# Patient Record
Sex: Male | Born: 1965 | Race: Black or African American | Hispanic: No | Marital: Married | Smoking: Former smoker
Health system: Southern US, Community
[De-identification: ages and names within clinical notes are randomized; demographics above are authoritative.]

## PROBLEM LIST (undated history)

## (undated) DIAGNOSIS — E66811 Obesity, class 1: Secondary | ICD-10-CM

## (undated) DIAGNOSIS — E669 Obesity, unspecified: Secondary | ICD-10-CM

## (undated) DIAGNOSIS — I10 Essential (primary) hypertension: Secondary | ICD-10-CM

## (undated) DIAGNOSIS — D649 Anemia, unspecified: Secondary | ICD-10-CM

## (undated) DIAGNOSIS — B009 Herpesviral infection, unspecified: Principal | ICD-10-CM

## (undated) DIAGNOSIS — R748 Abnormal levels of other serum enzymes: Secondary | ICD-10-CM

## (undated) DIAGNOSIS — Z8719 Personal history of other diseases of the digestive system: Secondary | ICD-10-CM

## (undated) DIAGNOSIS — E119 Type 2 diabetes mellitus without complications: Secondary | ICD-10-CM

## (undated) HISTORY — PX: TONSILLECTOMY: SUR1361

## (undated) HISTORY — DX: Type 2 diabetes mellitus without complications: E11.9

## (undated) HISTORY — DX: Abnormal levels of other serum enzymes: R74.8

## (undated) HISTORY — DX: Personal history of other diseases of the digestive system: Z87.19

## (undated) HISTORY — PX: KNEE SURGERY: SHX244

## (undated) HISTORY — DX: Obesity, class 1: E66.811

## (undated) HISTORY — DX: Essential (primary) hypertension: I10

## (undated) HISTORY — DX: Anemia, unspecified: D64.9

## (undated) HISTORY — DX: Herpesviral infection, unspecified: B00.9

## (undated) HISTORY — DX: Obesity, unspecified: E66.9

---

## 2014-08-08 LAB — HM COLONOSCOPY

## 2016-01-23 ENCOUNTER — Ambulatory Visit: Payer: Self-pay | Admitting: Nurse Practitioner

## 2016-03-05 ENCOUNTER — Ambulatory Visit: Payer: Self-pay | Admitting: Nurse Practitioner

## 2016-04-03 ENCOUNTER — Encounter: Payer: Self-pay | Admitting: Family Medicine

## 2016-04-03 ENCOUNTER — Ambulatory Visit (INDEPENDENT_AMBULATORY_CARE_PROVIDER_SITE_OTHER): Payer: Managed Care, Other (non HMO) | Admitting: Family Medicine

## 2016-04-03 VITALS — BP 106/60 | HR 62 | Ht 68.25 in | Wt 214.2 lb

## 2016-04-03 DIAGNOSIS — I10 Essential (primary) hypertension: Secondary | ICD-10-CM | POA: Diagnosis not present

## 2016-04-03 DIAGNOSIS — Z87898 Personal history of other specified conditions: Secondary | ICD-10-CM | POA: Insufficient documentation

## 2016-04-03 DIAGNOSIS — H6122 Impacted cerumen, left ear: Secondary | ICD-10-CM | POA: Diagnosis not present

## 2016-04-03 DIAGNOSIS — E119 Type 2 diabetes mellitus without complications: Secondary | ICD-10-CM | POA: Diagnosis not present

## 2016-04-03 DIAGNOSIS — Z7689 Persons encountering health services in other specified circumstances: Secondary | ICD-10-CM | POA: Diagnosis not present

## 2016-04-03 DIAGNOSIS — H8110 Benign paroxysmal vertigo, unspecified ear: Secondary | ICD-10-CM | POA: Insufficient documentation

## 2016-04-03 DIAGNOSIS — M25511 Pain in right shoulder: Secondary | ICD-10-CM

## 2016-04-03 DIAGNOSIS — G8929 Other chronic pain: Secondary | ICD-10-CM | POA: Insufficient documentation

## 2016-04-03 LAB — CBC WITH DIFFERENTIAL/PLATELET
Basophils Absolute: 41 cells/uL (ref 0–200)
Basophils Relative: 1 %
EOS ABS: 123 {cells}/uL (ref 15–500)
Eosinophils Relative: 3 %
HEMATOCRIT: 38.3 % — AB (ref 38.5–50.0)
HEMOGLOBIN: 12.6 g/dL — AB (ref 13.2–17.1)
LYMPHS ABS: 2337 {cells}/uL (ref 850–3900)
LYMPHS PCT: 57 %
MCH: 28 pg (ref 27.0–33.0)
MCHC: 32.9 g/dL (ref 32.0–36.0)
MCV: 85.1 fL (ref 80.0–100.0)
MONO ABS: 492 {cells}/uL (ref 200–950)
MPV: 10.5 fL (ref 7.5–12.5)
Monocytes Relative: 12 %
NEUTROS PCT: 27 %
Neutro Abs: 1107 cells/uL — ABNORMAL LOW (ref 1500–7800)
Platelets: 169 10*3/uL (ref 140–400)
RBC: 4.5 MIL/uL (ref 4.20–5.80)
RDW: 14.2 % (ref 11.0–15.0)
WBC: 4.1 10*3/uL (ref 4.0–10.5)

## 2016-04-03 LAB — COMPLETE METABOLIC PANEL WITH GFR
ALBUMIN: 4.3 g/dL (ref 3.6–5.1)
ALK PHOS: 119 U/L — AB (ref 40–115)
ALT: 32 U/L (ref 9–46)
AST: 25 U/L (ref 10–35)
BUN: 13 mg/dL (ref 7–25)
CALCIUM: 9.5 mg/dL (ref 8.6–10.3)
CHLORIDE: 105 mmol/L (ref 98–110)
CO2: 26 mmol/L (ref 20–31)
CREATININE: 1.19 mg/dL (ref 0.70–1.33)
GFR, EST AFRICAN AMERICAN: 81 mL/min (ref >=60)
GFR, Est Non African American: 70 mL/min (ref >=60)
Glucose, Bld: 109 mg/dL — ABNORMAL HIGH (ref 65–99)
Potassium: 4.4 mmol/L (ref 3.5–5.3)
Sodium: 140 mmol/L (ref 135–146)
Total Bilirubin: 0.4 mg/dL (ref 0.2–1.2)
Total Protein: 7.4 g/dL (ref 6.1–8.1)

## 2016-04-03 LAB — LIPID PANEL
CHOLESTEROL: 121 mg/dL (ref <200)
HDL: 46 mg/dL (ref >40)
LDL Cholesterol: 63 mg/dL (ref <100)
TRIGLYCERIDES: 58 mg/dL (ref <150)
Total CHOL/HDL Ratio: 2.6 Ratio (ref <5.0)
VLDL: 12 mg/dL (ref <30)

## 2016-04-03 LAB — POCT GLYCOSYLATED HEMOGLOBIN (HGB A1C): Hemoglobin A1C: 6.5

## 2016-04-03 MED ORDER — ATORVASTATIN CALCIUM 40 MG PO TABS: 40.0000 mg | ORAL_TABLET | Freq: Every day | ORAL | 5 refills | Status: DC

## 2016-04-03 MED ORDER — LISINOPRIL 10 MG PO TABS: 10.0000 mg | ORAL_TABLET | Freq: Every day | ORAL | 5 refills | Status: DC

## 2016-04-03 MED ORDER — METFORMIN HCL ER 500 MG PO TB24: 500.0000 mg | ORAL_TABLET | Freq: Every evening | ORAL | 5 refills | Status: DC

## 2016-04-03 MED ORDER — NAPROXEN 500 MG PO TABS: 500.0000 mg | ORAL_TABLET | Freq: Two times a day (BID) | ORAL | 5 refills | Status: DC

## 2016-04-03 NOTE — Progress Notes (Signed)
Subjective:    Patient ID: Randall Calderon, male    DOB: 08-22-1965, 51 y.o.   MRN: [id number]  HPI Chief Complaint  Patient presents with  . new pt    new pt get established. refill on meds   He is new to the practice and here to establish care.  Previous medical care: in [place] Moved here in September 2017. Last CPE: in 2017  Concerns today include left ear itching. No ear pain, hearing loss, rhinorrhea, nasal congestion, sore throat, cough.  Denies fever, chills, unexplained weight loss, fatigue, headache, dizziness, chest pain, palpitations, abdominal pain, GI or GU symptoms.   Other providers: none in the area.   Past medical history: DM type 2 and diagnosed 1-2 years ago. States diabetes has always been well controlled and A1c is typically in the 6 range. Had a diabetic eye exam and states it was normal. Does not check blood sugars at home.  Blood pressure has been well controlled.   Right shoulder pain, chronic- has been to PT Hemorrhoids.   Family history: heart disease and stroke in mother, father with throat cancer and both deceased.   Social history: Lives with wife and 52 year old son, works as a [employer] architecttruck driver.  Former light smoker and quit many years ago. No alcohol use. History of drug use, heroin but nothing since 1998.  Diet: healthy diet. Does like ice cream.  Exercise: daily. 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 His son plays basketball.   Immunizations: zostavax May 2016. Tdap ?  Health maintenance:  Colonoscopy: 2016 and normal per patient- due in 2026 Last Eye Exam: last year and has an upcoming appointment.   Wears seatbelt always, uses sunscreen, smoke detectors in home and functioning, does not text while driving, feels safe in home environment.  Reviewed allergies, medications, past medical, surgical, family, and social history.    Review of Systems Pertinent positives and negatives in the history of present illness.     Objective:   Physical  Exam  Constitutional: He is oriented to person, place, and time. He appears well-developed and well-nourished. No distress.  HENT:  Right Ear: Hearing, tympanic membrane and ear canal normal.  Left Ear: Hearing, tympanic membrane and ear canal normal.  Nose: Nose normal.  Mouth/Throat: Uvula is midline, oropharynx is clear and moist and mucous membranes are normal.  Left ear canal with cerumen impaction initially. Post lavage TM and canal normal  Eyes: Conjunctivae are normal. Pupils are equal, round, and reactive to light.  Neck: Normal range of motion. Neck supple.  Cardiovascular: Normal rate, regular rhythm, normal heart sounds and intact distal pulses.   Pulmonary/Chest: Effort normal and breath sounds normal.  Lymphadenopathy:    He has no cervical adenopathy.  Neurological: He is alert and oriented to person, place, and time.  Skin: Skin is warm and dry. No erythema. No pallor.  Psychiatric: He has a normal mood and affect. His behavior is normal. Judgment and thought content normal.   BP 106/60   Pulse 62   Ht 5' 8.25" (1.734 m)   Wt 214 lb 3.2 oz (97.2 kg)   BMI 32.33 kg/m       Assessment & Plan:  Controlled type 2 diabetes mellitus without complication, without long-term current use of insulin (HCC) - Plan: HgB A1c, CBC with Differential/Platelet, COMPLETE METABOLIC PANEL WITH GFR, Lipid panel  Encounter to establish care  Essential hypertension - Plan: CBC with Differential/Platelet, COMPLETE METABOLIC PANEL WITH GFR, Lipid panel  Left ear impacted cerumen  Reviewed medical records brought today by patient from previous PCP in [place].  A1c 6.5% today. Diabetes is well controlled. He will continue on once daily Metformin for now. Discussed continuing with healthy lifestyle. He is exercising and has a relatively healthy diet.  Blood pressure is within goal range. Continue on medication.  Will check lipids. He is on a statin.  Left cerumen impaction- lavage  performed by Martie Lee, CMA. He tolerated this well.  Will refill medications.  Plan to have him return in 6 months for fasting med check. Follow up pending labs

## 2016-04-03 NOTE — Addendum Note (Signed)
Addended by: Herminio CommonsJOHNSON, SABRINA A on: 04/03/2016 11:06 AM   Modules accepted: Orders

## 2016-04-08 ENCOUNTER — Other Ambulatory Visit: Payer: Self-pay | Admitting: Family Medicine

## 2016-05-14 ENCOUNTER — Encounter: Payer: Self-pay | Admitting: Family Medicine

## 2016-05-15 MED ORDER — METFORMIN HCL ER 500 MG PO TB24: 500.0000 mg | ORAL_TABLET | Freq: Every evening | ORAL | 1 refills | Status: DC

## 2016-05-21 ENCOUNTER — Encounter: Payer: Self-pay | Admitting: Family Medicine

## 2016-05-22 MED ORDER — ATORVASTATIN CALCIUM 40 MG PO TABS: 40.0000 mg | ORAL_TABLET | Freq: Every day | ORAL | 5 refills | Status: DC

## 2016-10-07 ENCOUNTER — Encounter: Payer: Managed Care, Other (non HMO) | Admitting: Family Medicine

## 2016-10-07 ENCOUNTER — Telehealth: Payer: Self-pay | Admitting: Internal Medicine

## 2016-10-07 NOTE — Telephone Encounter (Signed)

## 2016-10-07 NOTE — Telephone Encounter (Signed)
I recommend he follow up in the next few weeks.

## 2016-10-08 ENCOUNTER — Encounter: Payer: Self-pay | Admitting: Family Medicine

## 2016-10-08 NOTE — Telephone Encounter (Signed)
No show letter with appt request sent.  °

## 2016-10-14 ENCOUNTER — Encounter: Payer: Self-pay | Admitting: Family Medicine

## 2016-10-18 ENCOUNTER — Ambulatory Visit (INDEPENDENT_AMBULATORY_CARE_PROVIDER_SITE_OTHER): Payer: Managed Care, Other (non HMO) | Admitting: Family Medicine

## 2016-10-18 ENCOUNTER — Encounter: Payer: Self-pay | Admitting: Family Medicine

## 2016-10-18 ENCOUNTER — Ambulatory Visit
Admission: RE | Admit: 2016-10-18 | Discharge: 2016-10-18 | Disposition: A | Discharge status: Home / Self Care | Payer: Managed Care, Other (non HMO) | Source: Ambulatory Visit | Attending: Family Medicine | Admitting: Family Medicine

## 2016-10-18 VITALS — BP 128/80 | HR 64 | Temp 97.7°F | Wt 211.6 lb

## 2016-10-18 VITALS — BP 122/70 | HR 56 | Resp 16 | Wt 211.8 lb

## 2016-10-18 DIAGNOSIS — M25561 Pain in right knee: Secondary | ICD-10-CM | POA: Diagnosis not present

## 2016-10-18 DIAGNOSIS — M25461 Effusion, right knee: Secondary | ICD-10-CM

## 2016-10-18 MED ORDER — NAPROXEN SODIUM 220 MG PO TABS: 220.0000 mg | ORAL_TABLET | Freq: Two times a day (BID) | ORAL | 0 refills | Status: DC

## 2016-10-18 MED ORDER — TRIAMCINOLONE ACETONIDE 40 MG/ML IJ SUSP
40.0000 mg | Freq: Once | INTRAMUSCULAR | Status: AC
  Administered 2016-10-18: 40 mg via INTRAMUSCULAR

## 2016-10-18 MED ORDER — LIDOCAINE HCL (PF) 2 % IJ SOLN
2.0000 mL | Freq: Once | INTRAMUSCULAR | Status: AC
  Administered 2016-10-18: 2 mL

## 2016-10-18 NOTE — Patient Instructions (Signed)
Try 2 Aleve twice daily with food, use ice 15 minutes at a time 4-6 times per day. Elevate when you can. You may continue using a knee brace if this helps.  We will call you with your XR result.

## 2016-10-18 NOTE — Progress Notes (Signed)
   Subjective:    Patient ID: Randall Calderon, male    DOB: 1965/10/17, 51 y.o.   MRN: [id number]  HPI He is here for evaluation of continued difficulty with right knee pain and effusion. His record was reviewed. He did have a positive McMurray's on his previous evaluation. He is here for possible aspiration/injection.   Review of Systems     Objective:   Physical Exam Alert and in no distress. Right knee exam does show a moderate effusion. Medial and lateral collateral ligaments intact. Negative anterior drawer. McMurray's testing medially did cause discomfort.       Assessment & Plan:  Acute pain of right knee - Plan: lidocaine (XYLOCAINE) 2 % injection 2 mL, triamcinolone acetonide (KENALOG-40) injection 40 mg  Effusion of right knee I discussed the diagnosis of probable meniscal damage and the fact that his age speaks against being overly aggressive with possible surgical intervention. Discussed injection and he agreed. The right knee was prepped laterally and 40 mg of Kenalog and 3 mL of Xylocaine was injected into the joint without difficulty. He tolerated the procedure well and did obtain some relief of his symptoms. I will refer him to physical therapy.

## 2016-10-18 NOTE — Progress Notes (Signed)
   Subjective:    Patient ID: Randall Calderon, male    DOB: 1965/12/16, 51 y.o.   MRN: [id number]  HPI Chief Complaint  Patient presents with  . knee pain    knee pain, popping sensation and feels its dislocating it and pain will go down leg   He is here with complaints of a 6 week history of right knee pain and swelling that has been gradually worsening since dancing on it last weekend. Does not recall injury at onset of pain.  Pain is anterior and medially and occasionally radiates down the medial aspect of shin. States his knee is "popping". No locking or giving away. Pian is severe when going up and down steps and improves with rest.  States he has tried ice, heat, salon pas. No oral antiinflammatories.   Denies fever, chills, redness. No other joints are painful.  States he has a history of knee arthroscopy but cannot recall which knee it was.  Denies having an orthopedist locally.   Reviewed allergies, medications, past medical, surgical, and social history.    Review of Systems Pertinent positives and negatives in the history of present illness.     Objective:   Physical Exam  Musculoskeletal:       Right knee: He exhibits effusion. He exhibits no LCL laxity and no MCL laxity. Tenderness found. Medial joint line tenderness noted.  Positive Mcmurrays No erythema, warmth.  RLE is neurovascularly intact.    BP 128/80   Pulse 64   Temp 97.7 F (36.5 C) (Oral)   Wt 211 lb 9.6 oz (96 kg)   BMI 31.94 kg/m      Assessment & Plan:  Acute pain of right knee - Plan: DG Knee Complete 4 Views Right  No sign of infection.  Will have him start taking 2 Aleve twice daily and use ice. Aleve samples given #12. Discussed conservative treatment. He has been trying topical medication and ice with minimal relief. Plan to send him for an XR of the knee and will consider referral to ortho if he is not improving with conservative treatment.

## 2016-10-21 NOTE — Addendum Note (Signed)
Addended by: Shaune SpittleHANKS, Leonardo Plaia D on: 10/21/2016 09:50 AM   Modules accepted: Level of Service

## 2016-10-24 ENCOUNTER — Encounter: Payer: Self-pay | Admitting: Family Medicine

## 2016-10-24 ENCOUNTER — Ambulatory Visit (INDEPENDENT_AMBULATORY_CARE_PROVIDER_SITE_OTHER): Payer: Managed Care, Other (non HMO) | Admitting: Family Medicine

## 2016-10-24 VITALS — BP 118/84 | HR 63 | Wt 215.2 lb

## 2016-10-24 DIAGNOSIS — D709 Neutropenia, unspecified: Secondary | ICD-10-CM | POA: Diagnosis not present

## 2016-10-24 DIAGNOSIS — Z79899 Other long term (current) drug therapy: Secondary | ICD-10-CM

## 2016-10-24 DIAGNOSIS — I10 Essential (primary) hypertension: Secondary | ICD-10-CM | POA: Diagnosis not present

## 2016-10-24 DIAGNOSIS — E119 Type 2 diabetes mellitus without complications: Secondary | ICD-10-CM

## 2016-10-24 DIAGNOSIS — E1169 Type 2 diabetes mellitus with other specified complication: Secondary | ICD-10-CM

## 2016-10-24 DIAGNOSIS — E785 Hyperlipidemia, unspecified: Secondary | ICD-10-CM

## 2016-10-24 LAB — POCT GLYCOSYLATED HEMOGLOBIN (HGB A1C): Hemoglobin A1C: 6.5

## 2016-10-24 NOTE — Progress Notes (Signed)
   Subjective:    Patient ID: Randall Calderon, Randall Calderon    DOB: Feb 22, 1965, 51 y.o.   MRN: [id number]  HPI Chief Complaint  Patient presents with  . med check    nmed check and fasting labs   He is here for a fasting med check.   He is starting PT next week for right knee pain. He is taking Aleve for knee pain.   States he is doing well on medications for HTN, DM, and hyperlipidemia. No concerns or complaints.   He drives a truck for [employer].  States he has a healthy diet and exercises.   States diabetic eye exam was earlier this year.    Denies fever, chills, dizziness, chest pain, palpitations, shortness of breath, abdominal pain, N/V/D, urinary symptoms, LE edema.   Reviewed allergies, medications, past medical, surgical, and social history.   Review of Systems Pertinent positives and negatives in the history of present illness.     Objective:   Physical Exam BP 118/84   Pulse 63   Wt 215 lb 3.2 oz (97.6 kg)   SpO2 97%   BMI 32.48 kg/m  Alert and oriented and in no acute distress. Foot exam done and normal.   Hemoglobin A1c done 6.5%     Assessment & Plan:  Controlled type 2 diabetes mellitus without complication, without long-term current use of insulin (HCC) - Plan: HgB A1c, CBC with Differential/Platelet, COMPLETE METABOLIC PANEL WITH GFR, Lipid panel, Microalbumin / creatinine urine ratio  Essential hypertension - Plan: CBC with Differential/Platelet, COMPLETE METABOLIC PANEL WITH GFR  Neutropenia, unspecified type (HCC) - Plan: CBC with Differential/Platelet  Medication management - Plan: Lipid panel  Hyperlipidemia associated with type 2 diabetes mellitus (HCC) - Plan: Lipid panel  He appears to be doing well with medications and compliance, no concerns.  Hgb A1c is 6.5% and diabetes is well controlled. Continue with healthy lifestyle and medications.  BP is at goal. Continue current medications, healthy diet and exercise.  Will check lipids and follow up. He  will continue statin.  Foot exam done and normal.  microalbumin done.  Eye exam up to date.  Declines flu shot.  Return for CPE in 6 months.

## 2016-10-25 LAB — CBC WITH DIFFERENTIAL/PLATELET
BASOS PCT: 0.7 %
Basophils Absolute: 39 cells/uL (ref 0–200)
EOS ABS: 162 {cells}/uL (ref 15–500)
Eosinophils Relative: 2.9 %
HEMATOCRIT: 38.1 % — AB (ref 38.5–50.0)
HEMOGLOBIN: 13.1 g/dL — AB (ref 13.2–17.1)
LYMPHS ABS: 3063 {cells}/uL (ref 850–3900)
MCH: 29.8 pg (ref 27.0–33.0)
MCHC: 34.4 g/dL (ref 32.0–36.0)
MCV: 86.6 fL (ref 80.0–100.0)
MPV: 10.4 fL (ref 7.5–12.5)
Monocytes Relative: 9 %
NEUTROS ABS: 1831 {cells}/uL (ref 1500–7800)
Neutrophils Relative %: 32.7 %
PLATELETS: 217 10*3/uL (ref 140–400)
RBC: 4.4 10*6/uL (ref 4.20–5.80)
RDW: 12.1 % (ref 11.0–15.0)
Total Lymphocyte: 54.7 %
WBC: 5.6 10*3/uL (ref 3.8–10.8)
WBCMIX: 504 {cells}/uL (ref 200–950)

## 2016-10-25 LAB — COMPLETE METABOLIC PANEL WITH GFR
AG RATIO: 1.6 (calc) (ref 1.0–2.5)
ALT: 24 U/L (ref 9–46)
AST: 20 U/L (ref 10–35)
Albumin: 4.5 g/dL (ref 3.6–5.1)
Alkaline phosphatase (APISO): 118 U/L — ABNORMAL HIGH (ref 40–115)
BILIRUBIN TOTAL: 0.5 mg/dL (ref 0.2–1.2)
BUN: 21 mg/dL (ref 7–25)
CHLORIDE: 102 mmol/L (ref 98–110)
CO2: 26 mmol/L (ref 20–32)
Calcium: 9.6 mg/dL (ref 8.6–10.3)
Creat: 1.09 mg/dL (ref 0.70–1.33)
GFR, EST AFRICAN AMERICAN: 90 mL/min/{1.73_m2} (ref >=60)
GFR, Est Non African American: 78 mL/min/{1.73_m2} (ref >=60)
Globulin: 2.8 g/dL (calc) (ref 1.9–3.7)
Glucose, Bld: 102 mg/dL — ABNORMAL HIGH (ref 65–99)
POTASSIUM: 4.2 mmol/L (ref 3.5–5.3)
Sodium: 136 mmol/L (ref 135–146)
Total Protein: 7.3 g/dL (ref 6.1–8.1)

## 2016-10-25 LAB — LIPID PANEL
Cholesterol: 122 mg/dL (ref <200)
HDL: 47 mg/dL (ref >40)
LDL Cholesterol (Calc): 61 mg/dL (calc)
Non-HDL Cholesterol (Calc): 75 mg/dL (calc) (ref <130)
Total CHOL/HDL Ratio: 2.6 (calc) (ref <5.0)
Triglycerides: 59 mg/dL (ref <150)

## 2016-10-25 LAB — MICROALBUMIN / CREATININE URINE RATIO
CREATININE, URINE: 341 mg/dL — AB (ref 20–320)
Microalb Creat Ratio: 4 mcg/mg creat (ref <30)
Microalb, Ur: 1.2 mg/dL

## 2016-10-29 ENCOUNTER — Ambulatory Visit: Payer: Managed Care, Other (non HMO) | Attending: Family Medicine | Admitting: Physical Therapy

## 2016-10-29 DIAGNOSIS — M25561 Pain in right knee: Secondary | ICD-10-CM | POA: Diagnosis present

## 2016-10-29 DIAGNOSIS — M25661 Stiffness of right knee, not elsewhere classified: Secondary | ICD-10-CM | POA: Diagnosis present

## 2016-10-29 DIAGNOSIS — M6281 Muscle weakness (generalized): Secondary | ICD-10-CM

## 2016-10-29 NOTE — Patient Instructions (Signed)
Hamstring Step 2    Right foot relaxed, knee straight, other leg bent, foot flat. Raise straight leg further upward to maximal range. Hold _30__ seconds. Relax leg completely down. Repeat _2-3_ times.    KNEE: Quadriceps - Prone    Place strap around ankle. Bring ankle toward buttocks. Hold _30__ seconds. _2-3_ reps per set, _1-2__ sets per day.  Extension    Lift leg up in the air and bring it back down.  Hold for 2-3 seconds. Repeat _10-15___ times. Do _1-2___ sessions per day.  Straight Leg Raise    Slowly raise locked right leg __6-8__ inches from floor.  Hold for 2-3 seconds. Repeat _10-15__ times per set. Do __1__ sets per session. Do __1-2__ sessions per day.

## 2016-10-29 NOTE — Therapy (Signed)
[facility] [address] [place], [place], [address] Phone: [phone]   Fax:  [phone]  Physical Therapy Evaluation  Patient Details  Name: Randall Calderon MRN: [id number] Date of Birth: [birth year] Referring Provider: Ronnald Nian, MD  Encounter Date: 10/29/2016      PT End of Session - 10/29/16 1010    Visit Number 1   Number of Visits 6   Date for PT Re-Evaluation 12/10/16   Authorization Type [employer]- $50 copay; 60 visit limit   PT Start Time 0927   PT Stop Time 1004   PT Time Calculation (min) 37 min   Activity Tolerance Patient tolerated treatment well   Behavior During Therapy [facility] for tasks assessed/performed      Past Medical History:  Diagnosis Date  . DM type 2 (diabetes mellitus, type 2) (HCC)   . HTN (hypertension)   . Obesity (BMI 30.0-34.9)     Past Surgical History:  Procedure Laterality Date  . KNEE SURGERY     18 years ago  . TONSILLECTOMY      There were no vitals filed for this visit.       Subjective Assessment - 10/29/16 0930    Subjective Pt is a 51 y/o male who presents to OPPT for Rt knee pain x 6-7 weeks.  Pt unable to recall MOI but reports pain since early Aug.  Pt had injection last Friday (10/25/16) with some improvement noted beginning yesterday 10/28/16.  Pt reports he's still having some pain especially after prolonged sitting.   Limitations Standing;Walking   How long can you stand comfortably? after sitting 30-40 min; just standing: 60 min   How long can you walk comfortably? only walking short distances currently   Diagnostic tests x rays: negative   Patient Stated Goals improve pain, wants to be able to run some short distances, go for walks with family   Currently in Pain? No/denies   Pain Score 0-No pain  up to 8/10   Pain Location Knee   Pain Orientation Right;Medial;Lateral;Posterior   Pain Descriptors / Indicators Stabbing;Burning  "electricity"   Pain Type Acute  pain;Chronic pain   Pain Onset More than a month ago   Pain Frequency Intermittent   Aggravating Factors  squatting, standing after prolonged sitting, walking backward up incline   Pain Relieving Factors injection, aleve            [facility] PT Assessment - 10/29/16 0938      Assessment   Medical Diagnosis Rt knee pain   Referring Provider Ronnald Nian, MD   Onset Date/Surgical Date 09/11/16   Next MD Visit PRN   Prior Therapy none recently     Precautions   Precautions None     Restrictions   Weight Bearing Restrictions No     Balance Screen   Has the patient fallen in the past 6 months No   Has the patient had a decrease in activity level because of a fear of falling?  No   Is the patient reluctant to leave their home because of a fear of falling?  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  Driving up to 1 hour at a time.   Leisure video games; videographer; regular exercise: daily body weight exercises     Cognition   Overall Cognitive Status Within Functional Limits for tasks assessed     Observation/Other Assessments   Focus on Therapeutic Outcomes (FOTO)  30 (70% limited; predicted 41% limited)     ROM / Strength   AROM / PROM / Strength AROM;Strength     AROM   AROM Assessment Site Knee   Right/Left Knee Right;Left   Right Knee Extension 0   Right Knee Flexion 118   Left Knee Extension 0   Left Knee Flexion 134     Strength   Overall Strength Comments visible rectus femoris atrophy  noted on Rt   Strength Assessment Site Hip;Knee   Right/Left Hip Right;Left   Right Hip Flexion 3+/5   Right Hip Extension 3+/5   Right Hip External Rotation  3+/5   Right Hip Internal Rotation 4/5   Right Hip ABduction 3+/5  with attempted TFL substitution   Left Hip Flexion 5/5   Left Hip Extension 4/5   Left Hip External Rotation 4/5   Left Hip Internal Rotation 5/5   Left Hip ABduction 5/5   Right/Left Knee Right;Left   Right Knee Flexion 4/5   Right Knee Extension 4-/5  with pain   Left Knee Flexion 5/5   Left Knee Extension 5/5     Flexibility   Soft Tissue Assessment /Muscle Length yes   Hamstrings tightness bil   Quadriceps tightness on Rt     Palpation   Palpation comment tightness noted in Rt lateral quad; good VMO activation     Special Tests    Special Tests --     Ambulation/Gait   Gait Comments no significant gait deviations noted during exam            Objective measurements completed on examination: See above findings.          [facility] Adult PT Treatment/Exercise - 10/29/16 0938      Exercises   Exercises Knee/Hip     Knee/Hip Exercises: Stretches   Passive Hamstring Stretch Right;1 rep;30 seconds   Quad Stretch Right;1 rep;30 seconds     Knee/Hip Exercises: Supine   Straight Leg Raises Right;5 reps     Knee/Hip Exercises: Prone   Straight Leg Raises Right;5 reps                PT Education - 10/29/16 1010    Education provided Yes   Education Details clinical findings, HEP   Person(s) Educated Patient   Methods Explanation;Demonstration;Handout   Comprehension Verbalized understanding;Returned demonstration;Need further instruction             PT Long Term Goals - 10/29/16 1116      PT LONG TERM GOAL #1   Title independent with HEP   Time 6   Period Weeks   Status New   Target Date 12/10/16     PT LONG TERM GOAL #2   Title improve Rt knee AROM 0-130 for improved motion and flexibility in order to get  into/out of truck easier   Time 6   Period Weeks   Status New   Target Date 12/10/16     PT LONG TERM GOAL #3   Title  demonstrate at least 4/5 RLE strength for improved strength and function   Time 6   Period Weeks   Status New   Target Date 12/10/16     PT LONG TERM GOAL #4   Title report pain < 3/10 with work activity for improved function and activity tolerance   Time 6   Period Weeks   Status New   Target Date 12/10/16     PT LONG TERM GOAL #5   Title demonstrate ability to perform at least 10 reps of functional squat without increase in pain for improved strength and function   Time 6   Period Weeks   Status New   Target Date 12/10/16                Plan - 10/29/16 1111    Clinical Impression Statement Pt is a 51 y/o male who presents to OPPT for recent onset of Rt knee pain.  Pt demonstrates mild ROM deficits, decreased strength with visible quad atrophy, and pain affecting functional mobility.  Pt will benefit from PT to address deficits.  May need to rule out neurological involvement if atrophy or weakness progresses, as it seems to be somewhat atypical for given diagnosis and history.  Will monitor and refer as indicated.     History and Personal Factors relevant to plan of care: HTN, DM   Clinical Presentation Evolving   Clinical Presentation due to: significant quad atrophy with unknown cause   Clinical Decision Making Moderate   Rehab Potential Good   PT Frequency 1x / week  recommend 2x/wk but pt requesting 1x/wk due to high copay   PT Duration 6 weeks   PT Treatment/Interventions ADLs/Self Care Home Management;Cryotherapy;Electrical Stimulation;Moist Heat;Ultrasound;Neuromuscular re-education;Therapeutic exercise;Therapeutic activities;Functional mobility training;Stair training;Gait training;Patient/family education;Manual techniques;Passive range of motion;Vasopneumatic Device;Taping;Dry needling   PT Next Visit Plan review HEP; add additional hip  strengthening exercises; modalities PRN for pain; ?TDN/manual to lateral quad   Consulted and Agree with Plan of Care Patient      Patient will benefit from skilled therapeutic intervention in order to improve the following deficits and impairments:  Pain, Decreased strength, Decreased mobility, Difficulty walking, Decreased range of motion, Increased muscle spasms, Increased fascial restricitons  Visit Diagnosis: Acute pain of right knee - Plan: PT plan of care cert/re-cert  Stiffness of right knee, not elsewhere classified - Plan: PT plan of care cert/re-cert  Muscle weakness (generalized) - Plan: PT plan of care cert/re-cert     Problem List Patient Active Problem List   Diagnosis Date Noted  . Controlled type 2 diabetes mellitus without complication, without long-term current use of insulin (HCC) 04/03/2016  . BPPV (benign paroxysmal positional vertigo) 04/03/2016  . History of chest pain 04/03/2016  . Chronic right shoulder pain 04/03/2016  . Essential hypertension 04/03/2016      Clarita Crane, PT, DPT 10/29/16 11:20 AM    [facility] [address] [place], [place], [address] Phone: [phone]   Fax:  [phone]  Name: Randall Calderon MRN: [id number] Date of Birth: November 01, 1965

## 2016-11-06 ENCOUNTER — Ambulatory Visit: Payer: Managed Care, Other (non HMO) | Admitting: Physical Therapy

## 2016-11-06 DIAGNOSIS — M25661 Stiffness of right knee, not elsewhere classified: Secondary | ICD-10-CM

## 2016-11-06 DIAGNOSIS — M6281 Muscle weakness (generalized): Secondary | ICD-10-CM

## 2016-11-06 DIAGNOSIS — M25561 Pain in right knee: Secondary | ICD-10-CM

## 2016-11-06 NOTE — Therapy (Signed)
[facility]patient Rehabilitation [facility] [address] [place], [place], [address] Phone: [phone]   Fax:  [phone]  Physical Therapy Treatment  Patient Details  Name: Randall Calderon MRN: [id number] Date of Birth: 12-27-1965 Referring Provider: Ronnald Nian, MD  Encounter Date: 11/06/2016      PT End of Session - 11/06/16 0844    Visit Number 2   Number of Visits 6   Date for PT Re-Evaluation 12/10/16   Authorization Type [employer]- $50 copay; 60 visit limit   PT Start Time 0757   PT Stop Time 0838   PT Time Calculation (min) 41 min   Activity Tolerance Patient tolerated treatment well   Behavior During Therapy [facility] for tasks assessed/performed      Past Medical History:  Diagnosis Date  . DM type 2 (diabetes mellitus, type 2) (HCC)   . HTN (hypertension)   . Obesity (BMI 30.0-34.9)     Past Surgical History:  Procedure Laterality Date  . KNEE SURGERY     18 years ago  . TONSILLECTOMY      There were no vitals filed for this visit.      Subjective Assessment - 11/06/16 0801    Subjective feeling better today; having a little pain on posterior medial knee as well as along medial and lateral joint line.  no brace today.  pain still with prolonged sitting.   Patient Stated Goals improve pain, wants to be able to run some short distances, go for walks with family   Currently in Pain? No/denies                         [facility] Adult PT Treatment/Exercise - 11/06/16 0802      Knee/Hip Exercises: Stretches   Passive Hamstring Stretch Right;3 reps;30 seconds   Passive Hamstring Stretch Limitations supine with strap   Quad Stretch Right;3 reps;30 seconds   Quad Stretch Limitations prone with strap     Knee/Hip Exercises: Aerobic   Stationary Bike L3 x 6 min     Knee/Hip Exercises: Machines for Strengthening   Cybex Knee Extension RLE only 15# 2x10   Cybex Knee Flexion RLE only 15# 2x10     Knee/Hip Exercises: Standing    Other Standing Knee Exercises sidestepping, forward/backward monster walk 30' x 2 each direction with red theraband     Knee/Hip Exercises: Seated   Long Arc Quad Right;10 reps   Long Arc Quad Weight 3 lbs.   Long [employer] Limitations with ball squeeze     Knee/Hip Exercises: Supine   Single Leg Bridge Both;10 reps  with ball squeeze and maintaining knee extension   Straight Leg Raises Right;10 reps   Straight Leg Raises Limitations 3#   Other Supine Knee/Hip Exercises bridging with hamstring curls on green physioball x 10 reps     Knee/Hip Exercises: Prone   Straight Leg Raises Right;10 reps   Straight Leg Raises Limitations 3#                PT Education - 11/06/16 0844    Education provided Yes   Education Details added to HEP   Person(s) Educated Patient   Methods Explanation;Demonstration;Handout   Comprehension Verbalized understanding;Returned demonstration;Need further instruction             PT Long Term Goals - 10/29/16 1116      PT LONG TERM GOAL #1   Title independent with HEP   Time 6  Period Weeks   Status New   Target Date 12/10/16     PT LONG TERM GOAL #2   Title improve Rt knee AROM 0-130 for improved motion and flexibility in order to get into/out of truck easier   Time 6   Period Weeks   Status New   Target Date 12/10/16     PT LONG TERM GOAL #3   Title demonstrate at least 4/5 RLE strength for improved strength and function   Time 6   Period Weeks   Status New   Target Date 12/10/16     PT LONG TERM GOAL #4   Title report pain < 3/10 with work activity for improved function and activity tolerance   Time 6   Period Weeks   Status New   Target Date 12/10/16     PT LONG TERM GOAL #5   Title demonstrate ability to perform at least 10 reps of functional squat without increase in pain for improved strength and function   Time 6   Period Weeks   Status New   Target Date 12/10/16               Plan - 11/06/16 0845     Clinical Impression Statement Pt reported mild ant knee pain at full extension with exercises which resolves immediately following completion of exercise.  No pain at end of session and able to tolerate strengthening exercises well.  Still with visible quad atrophy but pt reports he feels this is getting better.  Will continue to benefit from PT to maximize function.   PT Treatment/Interventions ADLs/Self Care Home Management;Cryotherapy;Electrical Stimulation;Moist Heat;Ultrasound;Neuromuscular re-education;Therapeutic exercise;Therapeutic activities;Functional mobility training;Stair training;Gait training;Patient/family education;Manual techniques;Passive range of motion;Vasopneumatic Device;Taping;Dry needling   PT Next Visit Plan review HEP; continue strengthening, hip ir/er with band; weight bearing strengthening exercises; modalities PRN for pain; ?TDN/manual to lateral quad   Consulted and Agree with Plan of Care Patient      Patient will benefit from skilled therapeutic intervention in order to improve the following deficits and impairments:  Pain, Decreased strength, Decreased mobility, Difficulty walking, Decreased range of motion, Increased muscle spasms, Increased fascial restricitons  Visit Diagnosis: Acute pain of right knee  Stiffness of right knee, not elsewhere classified  Muscle weakness (generalized)     Problem List Patient Active Problem List   Diagnosis Date Noted  . Controlled type 2 diabetes mellitus without complication, without long-term current use of insulin (HCC) 04/03/2016  . BPPV (benign paroxysmal positional vertigo) 04/03/2016  . History of chest pain 04/03/2016  . Chronic right shoulder pain 04/03/2016  . Essential hypertension 04/03/2016      Clarita Crane, PT, DPT 11/06/16 8:53 AM     [facility] [address] [place], [place], [address] Phone: [phone]   Fax:   [phone]  Name: Randall Calderon MRN: [id number] Date of Birth: 02-16-1965

## 2016-11-06 NOTE — Patient Instructions (Signed)
Bridging (Single Leg)    Lie on back with feet shoulder width apart and right leg straight with ball squeezed between knees. Lift hips toward the ceiling while keeping leg straight. Hold __3__ seconds. Repeat with other leg. Repeat _10___ times. Do _1-2___ sessions per day.   Gymball: Hamstring Curl (Double Leg)    Lie on back, calves on ball, buttocks on floor. Raise buttocks then roll ball toward buttocks. Repeat __10_ times per set. Lower buttocks to floor between rolls.  Do 1-2 sessions per day.   KNEE: Extension, Long Arc Quad (Weight)    Place weight around leg. Squeeze ball or towel between knees.  Raise leg until knee is straight. Hold _1-2_ seconds. Use _3__ lb weight. _10__ reps per set, __1-2_ sets per day, _7__ days per week

## 2016-11-07 ENCOUNTER — Other Ambulatory Visit (INDEPENDENT_AMBULATORY_CARE_PROVIDER_SITE_OTHER): Payer: Managed Care, Other (non HMO)

## 2016-11-07 DIAGNOSIS — Z23 Encounter for immunization: Secondary | ICD-10-CM

## 2016-11-13 ENCOUNTER — Ambulatory Visit: Payer: Managed Care, Other (non HMO) | Attending: Family Medicine | Admitting: Physical Therapy

## 2016-11-13 DIAGNOSIS — M6281 Muscle weakness (generalized): Secondary | ICD-10-CM | POA: Diagnosis present

## 2016-11-13 DIAGNOSIS — M25561 Pain in right knee: Secondary | ICD-10-CM | POA: Insufficient documentation

## 2016-11-13 DIAGNOSIS — M25661 Stiffness of right knee, not elsewhere classified: Secondary | ICD-10-CM | POA: Insufficient documentation

## 2016-11-13 NOTE — Therapy (Signed)
[facility] Outpatient Rehabilitation [facility] [address] [place], [place], [address] Phone: [phone]   Fax:  [phone]  Physical Therapy Treatment  Patient Details  Name: Randall Calderon MRN: [id number] Date of Birth: Sep 05, 1965 Referring Provider: Ronnald Nian, MD  Encounter Date: 11/13/2016      PT End of Session - 11/13/16 0805    Visit Number 3   Number of Visits 6   Date for PT Re-Evaluation 12/10/16   PT Start Time 0800   PT Stop Time 0855   PT Time Calculation (min) 55 min      Past Medical History:  Diagnosis Date  . DM type 2 (diabetes mellitus, type 2) (HCC)   . HTN (hypertension)   . Obesity (BMI 30.0-34.9)     Past Surgical History:  Procedure Laterality Date  . KNEE SURGERY     18 years ago  . TONSILLECTOMY      There were no vitals filed for this visit.      Subjective Assessment - 11/13/16 0803    Subjective Increased medial knee pain since last week.    Currently in Pain? Yes   Pain Score 7    Pain Location Knee   Pain Orientation Medial   Pain Descriptors / Indicators Sore;Sharp;Constant  out of line   Aggravating Factors  squatting, standing or sitting too long and getting up   Pain Relieving Factors aleve, naproxen                          [facility] Adult PT Treatment/Exercise - 11/13/16 0001      Knee/Hip Exercises: Stretches   Active Hamstring Stretch Limitations 90/90 stretc 3 x 30 sec   Quad Stretch Right;3 reps;30 seconds   Quad Stretch Limitations prone with strap     Knee/Hip Exercises: Aerobic   Stationary Bike L3 x 6 min     Knee/Hip Exercises: Supine   Quad Sets 10 reps   Quad Sets Limitations 10 sec holds    Bridges Limitations x 10, x 10 with clam tension   Straight Leg Raises Right;10 reps   Other Supine Knee/Hip Exercises clam x 20 green     Knee/Hip Exercises: Sidelying   Hip ABduction 10 reps     Modalities   Modalities Cryotherapy;Moist Heat     Moist Heat Therapy   Number Minutes Moist Heat 5 Minutes  prior to patella mobs   Moist Heat Location Knee     Cryotherapy   Number Minutes Cryotherapy 10 Minutes   Cryotherapy Location Knee   Type of Cryotherapy Ice pack     Manual Therapy   Manual Therapy Joint mobilization   Joint Mobilization Patella creep mob after HMP , also IASTM to lateral quad                      PT Long Term Goals - 10/29/16 1116      PT LONG TERM GOAL #1   Title independent with HEP   Time 6   Period Weeks   Status New   Target Date 12/10/16     PT LONG TERM GOAL #2   Title improve Rt knee AROM 0-130 for improved motion and flexibility in order to get into/out of truck easier   Time 6   Period Weeks   Status New   Target Date 12/10/16     PT LONG TERM GOAL #3   Title demonstrate at least 4/5  RLE strength for improved strength and function   Time 6   Period Weeks   Status New   Target Date 12/10/16     PT LONG TERM GOAL #4   Title report pain < 3/10 with work activity for improved function and activity tolerance   Time 6   Period Weeks   Status New   Target Date 12/10/16     PT LONG TERM GOAL #5   Title demonstrate ability to perform at least 10 reps of functional squat without increase in pain for improved strength and function   Time 6   Period Weeks   Status New   Target Date 12/10/16               Plan - 11/13/16 0849    Clinical Impression Statement Pt reports increased medial knee pain since PT last week. perfoem gentle therex, manual and patella mobs. Pt reports no change in pain prior to ice at end of session.    PT Next Visit Plan review HEP; continue strengthening, hip ir/er with band; weight bearing strengthening exercises; modalities PRN for pain; ?TDN/manual to lateral quad   Consulted and Agree with Plan of Care Patient      Patient will benefit from skilled therapeutic intervention in order to improve the following deficits and impairments:  Pain, Decreased  strength, Decreased mobility, Difficulty walking, Decreased range of motion, Increased muscle spasms, Increased fascial restricitons  Visit Diagnosis: Acute pain of right knee  Stiffness of right knee, not elsewhere classified  Muscle weakness (generalized)     Problem List Patient Active Problem List   Diagnosis Date Noted  . Controlled type 2 diabetes mellitus without complication, without long-term current use of insulin (HCC) 04/03/2016  . BPPV (benign paroxysmal positional vertigo) 04/03/2016  . History of chest pain 04/03/2016  . Chronic right shoulder pain 04/03/2016  . Essential hypertension 04/03/2016    Sherrie Mustache, PTA 11/13/2016, 8:53 AM  [facility] [address] [place], [place], [address] Phone: [phone]   Fax:  [phone]  Name: Randall Calderon MRN: [id number] Date of Birth: 30-Jul-1965

## 2016-11-18 ENCOUNTER — Ambulatory Visit: Payer: Managed Care, Other (non HMO) | Admitting: Physical Therapy

## 2016-11-18 ENCOUNTER — Telehealth: Payer: Self-pay | Admitting: Physical Therapy

## 2016-11-18 NOTE — Telephone Encounter (Signed)
Made in error. Pt walked into clinic for 8:45 appt at 9:30.

## 2016-11-25 ENCOUNTER — Encounter: Payer: Self-pay | Admitting: Physical Therapy

## 2016-11-25 ENCOUNTER — Ambulatory Visit: Payer: Managed Care, Other (non HMO) | Admitting: Physical Therapy

## 2016-11-25 DIAGNOSIS — M25561 Pain in right knee: Secondary | ICD-10-CM

## 2016-11-25 DIAGNOSIS — M25661 Stiffness of right knee, not elsewhere classified: Secondary | ICD-10-CM

## 2016-11-25 DIAGNOSIS — M6281 Muscle weakness (generalized): Secondary | ICD-10-CM

## 2016-11-25 NOTE — Therapy (Addendum)
[place], [place], [address] Phone: [phone]   Fax:  [phone]  Physical Therapy Treatment/ Discharge Summary   Patient Details  Name: Randall Calderon MRN: [id number] Date of Birth: 06-01-1965 Referring Provider: Denita Lung, MD  Encounter Date: 11/25/2016      PT End of Session - 11/25/16 0923    Visit Number 4   Number of Visits 6   Date for PT Re-Evaluation 12/10/16   PT Start Time 0848   PT Stop Time 0930   PT Time Calculation (min) 42 min   Activity Tolerance Patient tolerated treatment well   Behavior During Therapy [facility] for tasks assessed/performed      Past Medical History:  Diagnosis Date  . DM type 2 (diabetes mellitus, type 2) ([place])   . HTN (hypertension)   . Obesity (BMI 30.0-34.9)     Past Surgical History:  Procedure Laterality Date  . KNEE SURGERY     18 years ago  . TONSILLECTOMY      There were no vitals filed for this visit.      Subjective Assessment - 11/25/16 0851    Subjective "since the last session still feeling about the same"    Currently in Pain? Yes   Pain Score 4    Pain Location Knee   Pain Orientation Medial   Pain Descriptors / Indicators Aching   Pain Type Acute pain;Chronic pain   Pain Onset More than a month ago   Pain Frequency Intermittent   Aggravating Factors  layiong onthe R side, prolonged sitting, squatting   Pain Relieving Factors stretching, exercise, epsom salt                         [facility] Adult PT Treatment/Exercise - 11/25/16 0918      Knee/Hip Exercises: Stretches   Active Hamstring Stretch 2 reps;30 seconds  with strap   Gastroc Stretch 2 reps;Both;30 seconds  slant board     Knee/Hip Exercises: Seated   Long Arc Quad 2 sets;20 reps;Right     Knee/Hip Exercises: Supine   Short Arc Quad Sets 2 sets;20 reps   Other Supine Knee/Hip Exercises clam x 20 green     Modalities   Modalities Ultrasound      Ultrasound   Ultrasound Location R medial knee   Ultrasound Parameters 1.0 w/cm2 level .8 x 8 min   Ultrasound Goals Pain     Manual Therapy   Manual Therapy Soft tissue mobilization   Joint Mobilization Patella creep mob after HMP , also IASTM to lateral quad    Soft tissue mobilization IASTM over medial knee                PT Education - 11/25/16 0922    Education provided Yes   Education Details updated HEp for SAQ   Person(s) Educated Patient   Methods Explanation   Comprehension Verbalized understanding             PT Long Term Goals - 10/29/16 1116      PT LONG TERM GOAL #1   Title independent with HEP   Time 6   Period Weeks   Status New   Target Date 12/10/16     PT LONG TERM GOAL #2   Title improve Rt knee AROM 0-130 for improved motion and flexibility in order to get into/out of truck easier   Time 6   Period Weeks  Status New   Target Date 12/10/16     PT LONG TERM GOAL #3   Title demonstrate at least 4/5 RLE strength for improved strength and function   Time 6   Period Weeks   Status New   Target Date 12/10/16     PT LONG TERM GOAL #4   Title report pain < 3/10 with work activity for improved function and activity tolerance   Time 6   Period Weeks   Status New   Target Date 12/10/16     PT LONG TERM GOAL #5   Title demonstrate ability to perform at least 10 reps of functional squat without increase in pain for improved strength and function   Time 6   Period Weeks   Status New   Target Date 12/10/16               Plan - 11/25/16 0932    Clinical Impression Statement pt reports feeling about the same since the last session but has been feeling better this morining. further assessment revealed possible MCL involvement. trialed [place] and progressed quad strengthening. post session he reported no pain.    PT Next Visit Plan review HEP; how was [place], continue strengthening, hip ir/er with band; weight bearing strengthening  exercises; modalities PRN for pain; ?TDN/manual to lateral quad   PT Home Exercise Plan SAQ strengthening,    Consulted and Agree with Plan of Care Patient      Patient will benefit from skilled therapeutic intervention in order to improve the following deficits and impairments:     Visit Diagnosis: Acute pain of right knee  Stiffness of right knee, not elsewhere classified  Muscle weakness (generalized)     Problem List Patient Active Problem List   Diagnosis Date Noted  . Controlled type 2 diabetes mellitus without complication, without long-term current use of insulin ([place]) 04/03/2016  . BPPV (benign paroxysmal positional vertigo) 04/03/2016  . History of chest pain 04/03/2016  . Chronic right shoulder pain 04/03/2016  . Essential hypertension 04/03/2016   Starr Lake PT, DPT, LAT, ATC  11/25/16  9:34 AM      [facility] [address] [place], [place], [address] Phone: [phone]   Fax:  [phone]  Name: Randall Calderon MRN: [id number] Date of Birth: [birth year]      PHYSICAL THERAPY DISCHARGE SUMMARY  Visits from Start of Care: 4  Current functional level related to goals / functional outcomes: unknown   Remaining deficits: unknown   Education / Equipment: HEP, theraband  Plan: Patient agrees to discharge.  Patient goals were not met. Patient is being discharged due to not returning since the last visit.  ?????     Alzada Brazee PT, DPT, LAT, ATC  12/23/16  2:04 PM

## 2016-12-05 ENCOUNTER — Telehealth: Payer: Self-pay | Admitting: Internal Medicine

## 2016-12-05 DIAGNOSIS — M25561 Pain in right knee: Principal | ICD-10-CM

## 2016-12-05 DIAGNOSIS — G8929 Other chronic pain: Secondary | ICD-10-CM

## 2016-12-05 NOTE — Telephone Encounter (Signed)
Vickie sent this back to patient's wife.   Davina,  Thanks for the information. Since he is continuing to have pain and conservative treatment has not worked, let's get him to an orthopedist and let them determine the next step. Has he seen an orthopedist here? If not, we can refer him. Typically [facility]. Let me know.

## 2016-12-05 NOTE — Telephone Encounter (Signed)
From Patient's wife:  Randall Calderon has not seen an orthopedist as of yet. [facility] is fine. When can I call to setup and appointment for Randall Calderon?   My response to her: referral will be put in and they will contact him.

## 2016-12-05 NOTE — Telephone Encounter (Signed)
This came off of pt's Wife mycahrt message:   Hi Randall Calderon,   I wanted to reach out to you on my husband's behalf. He's usually on the road and can't always followup. He is still having lateral right knee pain. He has had an X-Ray and four weeks of physical therapy. The knee is still causing him pain. Could we move forward and schedule an MRI of his right knee? This is for Randall Grummanerrill Calderon DOB 11/26/1965. Email [email or web address] or cell [phone].   Thank you

## 2016-12-06 ENCOUNTER — Telehealth: Payer: Self-pay | Admitting: Physical Therapy

## 2016-12-06 ENCOUNTER — Ambulatory Visit: Payer: Managed Care, Other (non HMO) | Admitting: Physical Therapy

## 2016-12-06 NOTE — Telephone Encounter (Signed)
Left message regarding no-show appointment this morning. Left a reminder of next two appointments and a request to call us if he needs to reschedule. Left reminder of attendance policy.

## 2016-12-10 ENCOUNTER — Encounter (INDEPENDENT_AMBULATORY_CARE_PROVIDER_SITE_OTHER): Payer: Self-pay | Admitting: Orthopaedic Surgery

## 2016-12-10 ENCOUNTER — Ambulatory Visit (INDEPENDENT_AMBULATORY_CARE_PROVIDER_SITE_OTHER): Payer: Managed Care, Other (non HMO) | Admitting: Orthopaedic Surgery

## 2016-12-10 DIAGNOSIS — M25561 Pain in right knee: Secondary | ICD-10-CM | POA: Diagnosis not present

## 2016-12-10 DIAGNOSIS — G8929 Other chronic pain: Secondary | ICD-10-CM

## 2016-12-10 MED ORDER — MELOXICAM 7.5 MG PO TABS: 7.5000 mg | ORAL_TABLET | Freq: Two times a day (BID) | ORAL | 2 refills | Status: DC | PRN

## 2016-12-10 MED ORDER — DICLOFENAC SODIUM 1 % TD GEL: 2.0000 g | Freq: Four times a day (QID) | TRANSDERMAL | 5 refills | Status: DC

## 2016-12-10 NOTE — Progress Notes (Signed)
Office Visit Note   Patient: Randall Calderon           Date of Birth: Jun 24, 1965           MRN: [id number] Visit Date: 12/10/2016              Requested by: Avanell Shackleton, NP-C [address] [place], [place] [address] PCP: Avanell Shackleton, NP-C   Assessment & Plan: Visit Diagnoses:  1. 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  Recommend MRI to evaluate for medial meniscal tear.  Questions encouraged and answered.  Follow-up after the MRI.  Follow-Up Instructions: Return in about 10 days (around 12/20/2016).   Orders:  No orders of the defined types were placed in this encounter.  Meds ordered this encounter  Medications  . diclofenac sodium (VOLTAREN) 1 % GEL    Sig: Apply 2 g topically 4 (four) times daily.    Dispense:  1 Tube    Refill:  5  . meloxicam (MOBIC) 7.5 MG tablet    Sig: Take 1 tablet (7.5 mg total) by mouth 2 (two) times daily as needed for pain.    Dispense:  30 tablet    Refill:  2      Procedures: No procedures performed   Clinical Data: No additional findings.   Subjective: Chief Complaint  Patient presents with  . Right Knee - Pain    Patient is a 51 year old gentleman who has had right knee pain for the last 3 months with progressive worsening.  The pain is worse with prolonged sitting or standing.  He endorses start up stiffness and pain.  He works as a [employer] architect.  Denies any swelling or numbness and tingling.  He does endorse some soreness and burning.  He has had a previous cortisone injection with temporary relief.  He has done physical therapy.  Naproxen and Aleve do not help significantly.    Review of Systems  Constitutional: Negative.   All other systems reviewed and are negative.    Objective: Vital Signs: There were no vitals taken for this visit.  Physical Exam  Constitutional: He is oriented to person, place, and time. He appears  well-developed and well-nourished.  HENT:  Head: Normocephalic and atraumatic.  Eyes: Pupils are equal, round, and reactive to light.  Neck: Neck supple.  Pulmonary/Chest: Effort normal.  Abdominal: Soft.  Musculoskeletal: Normal range of motion.  Neurological: He is alert and oriented to person, place, and time.  Skin: Skin is warm.  Psychiatric: He has a normal mood and affect. His behavior is normal. Judgment and thought content normal.  Nursing note and vitals reviewed.   Ortho Exam Right knee exam shows no joint effusion.  Collaterals and cruciates are stable.  Medial joint line tenderness. Specialty Comments:  No specialty comments available.  Imaging: No results found.   PMFS History: Patient Active Problem List   Diagnosis Date Noted  . Controlled type 2 diabetes mellitus without complication, without long-term current use of insulin (HCC) 04/03/2016  . BPPV (benign paroxysmal positional vertigo) 04/03/2016  . History of chest pain 04/03/2016  . Chronic right shoulder pain 04/03/2016  . Essential hypertension 04/03/2016   Past Medical History:  Diagnosis Date  . DM type 2 (diabetes mellitus, type 2) (HCC)   . HTN (hypertension)   . Obesity (BMI 30.0-34.9)     No family history on file.  Past Surgical History:  Procedure Laterality Date  . KNEE SURGERY     18 years ago  . TONSILLECTOMY     Social History   Occupational History  . Not on file.   Social History Main Topics  . Smoking status: Former Smoker    Years: 5.00  . Smokeless tobacco: Never Used  . Alcohol use No  . Drug use: No     Comment: history of heroin use and stopped 1998.   Marland Kitchen. Sexual activity: Not on file

## 2016-12-13 ENCOUNTER — Ambulatory Visit: Payer: Managed Care, Other (non HMO) | Admitting: Physical Therapy

## 2016-12-18 ENCOUNTER — Other Ambulatory Visit: Payer: Self-pay | Admitting: Family Medicine

## 2016-12-20 ENCOUNTER — Ambulatory Visit (INDEPENDENT_AMBULATORY_CARE_PROVIDER_SITE_OTHER): Payer: Managed Care, Other (non HMO) | Admitting: Orthopaedic Surgery

## 2016-12-20 ENCOUNTER — Encounter: Payer: Managed Care, Other (non HMO) | Admitting: Physical Therapy

## 2016-12-21 ENCOUNTER — Ambulatory Visit
Admission: RE | Admit: 2016-12-21 | Discharge: 2016-12-21 | Disposition: A | Discharge status: Home / Self Care | Payer: Managed Care, Other (non HMO) | Source: Ambulatory Visit | Attending: Orthopaedic Surgery | Admitting: Orthopaedic Surgery

## 2016-12-21 DIAGNOSIS — M25561 Pain in right knee: Principal | ICD-10-CM

## 2016-12-21 DIAGNOSIS — G8929 Other chronic pain: Secondary | ICD-10-CM

## 2016-12-23 ENCOUNTER — Encounter (INDEPENDENT_AMBULATORY_CARE_PROVIDER_SITE_OTHER): Payer: Self-pay | Admitting: Orthopaedic Surgery

## 2016-12-23 ENCOUNTER — Ambulatory Visit (INDEPENDENT_AMBULATORY_CARE_PROVIDER_SITE_OTHER): Payer: Managed Care, Other (non HMO) | Admitting: Orthopaedic Surgery

## 2016-12-23 DIAGNOSIS — M1711 Unilateral primary osteoarthritis, right knee: Secondary | ICD-10-CM | POA: Diagnosis not present

## 2016-12-23 DIAGNOSIS — M23303 Other meniscus derangements, unspecified medial meniscus, right knee: Secondary | ICD-10-CM

## 2016-12-23 MED ORDER — LIDOCAINE HCL 1 % IJ SOLN
2.0000 mL | INTRAMUSCULAR | Status: AC | PRN
  Administered 2016-12-23: 2 mL

## 2016-12-23 MED ORDER — BUPIVACAINE HCL 0.5 % IJ SOLN
2.0000 mL | INTRAMUSCULAR | Status: AC | PRN
  Administered 2016-12-23: 2 mL via INTRA_ARTICULAR

## 2016-12-23 MED ORDER — METHYLPREDNISOLONE ACETATE 40 MG/ML IJ SUSP
40.0000 mg | INTRAMUSCULAR | Status: AC | PRN
  Administered 2016-12-23: 40 mg via INTRA_ARTICULAR

## 2016-12-23 NOTE — Progress Notes (Signed)
Office Visit Note   Patient: Randall Calderon           Date of Birth: 1965-06-19           MRN: [id number] VisLeitha Schullerit Date: 12/23/2016              Requested by: Avanell ShackletonHenson, Vickie L, NP-C [address] [place], [place] [id number] PCP: Avanell ShackletonHenson, Vickie L, NP-C   Assessment & Plan: Visit Diagnoses:  1. Unilateral primary osteoarthritis, right knee   2. Derangement of medial meniscus, right     Plan: MRI findings are consistent with tricompartmental degenerative joint disease that is moderate with a complex tear of the medial meniscus and a root avulsion with a dysfunctional meniscus.  We discussed treatment options that include knee arthroscopy and debridement to see if this will improve his medial sided symptoms although he does understand that he does have degenerative joint disease of the medial compartment also.  He will give us a call for surgery in January.  For now he would like to have a cortisone injection which we performed today.  Follow-Up Instructions: Return if symptoms worsen or fail to improve.   Orders:  No orders of the defined types were placed in this encounter.  No orders of the defined types were placed in this encounter.     Procedures: Large Joint Inj: R knee on 12/23/2016 9:25 AM Indications: pain Details: 22 G needle  Arthrogram: No  Medications: 40 mg methylPREDNISolone acetate 40 MG/ML; 2 mL lidocaine 1 %; 2 mL bupivacaine 0.5 % Consent was given by the patient. Patient was prepped and draped in the usual sterile fashion.       Clinical Data: No additional findings.   Subjective: Chief Complaint  Patient presents with  . Right Knee - Pain, Follow-up    Patient follows up today for his right knee MRI.  He continues to have mainly medial sided pain.  He has had temporary relief from the previous injection.    Review of Systems  Constitutional: Negative.   All other systems reviewed and are negative.    Objective: Vital Signs: There were  no vitals taken for this visit.  Physical Exam  Constitutional: He is oriented to person, place, and time. He appears well-developed and well-nourished.  Pulmonary/Chest: Effort normal.  Abdominal: Soft.  Neurological: He is alert and oriented to person, place, and time.  Skin: Skin is warm.  Psychiatric: He has a normal mood and affect. His behavior is normal. Judgment and thought content normal.  Nursing note and vitals reviewed.   Ortho Exam Right knee exam shows no joint effusion.  Medial joint line tenderness. Specialty Comments:  No specialty comments available.  Imaging: No results found.   PMFS History: Patient Active Problem List   Diagnosis Date Noted  . Derangement of medial meniscus, right 12/23/2016  . Unilateral primary osteoarthritis, right knee 12/23/2016  . Controlled type 2 diabetes mellitus without complication, without long-term current use of insulin (HCC) 04/03/2016  . BPPV (benign paroxysmal positional vertigo) 04/03/2016  . History of chest pain 04/03/2016  . Chronic right shoulder pain 04/03/2016  . Essential hypertension 04/03/2016   Past Medical History:  Diagnosis Date  . DM type 2 (diabetes mellitus, type 2) (HCC)   . HTN (hypertension)   . Obesity (BMI 30.0-34.9)     History reviewed. No pertinent family history.  Past Surgical History:  Procedure Laterality Date  . KNEE SURGERY     18 years ago  . TONSILLECTOMY  Social History   Occupational History  . Not on file  Tobacco Use  . Smoking status: Former Smoker    Years: 5.00  . Smokeless tobacco: Never Used  Substance and Sexual Activity  . Alcohol use: No  . 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   Marland Kitchen. Sexual activity: Not on file

## 2017-01-09 ENCOUNTER — Telehealth (INDEPENDENT_AMBULATORY_CARE_PROVIDER_SITE_OTHER): Payer: Self-pay

## 2017-01-09 NOTE — Telephone Encounter (Signed)
Wife emailed me   "Good afternoon, My husband Randall Calderon surgery is next Friday, we are still waiting on the letter to send so that we can send to his job. We need the letter to start the paperwork for his FMLA and short-term disability. They are requesting a letter from his doctor's office that will have how long he could be out and the day of his surgery. Do you know when the letter will be emailed to Wallie and me? [email or web address]"

## 2017-01-09 NOTE — Telephone Encounter (Signed)
He will be out 6-8 weeks

## 2017-01-09 NOTE — Telephone Encounter (Signed)
Please advise. Thanks.  

## 2017-01-09 NOTE — Telephone Encounter (Signed)
Do you know exactly when the SU will be? I do not see anything on the Surgeries Tab? Then I will be able to complete the letter Thanks.

## 2017-01-10 ENCOUNTER — Encounter (INDEPENDENT_AMBULATORY_CARE_PROVIDER_SITE_OTHER): Payer: Self-pay

## 2017-01-10 NOTE — Telephone Encounter (Signed)
Letter made and emailed to [email or web address]

## 2017-01-10 NOTE — Telephone Encounter (Signed)
01-17-17 

## 2017-01-17 DIAGNOSIS — M23303 Other meniscus derangements, unspecified medial meniscus, right knee: Secondary | ICD-10-CM

## 2017-01-17 DIAGNOSIS — M659 Synovitis and tenosynovitis, unspecified: Secondary | ICD-10-CM | POA: Diagnosis not present

## 2017-01-18 ENCOUNTER — Encounter (INDEPENDENT_AMBULATORY_CARE_PROVIDER_SITE_OTHER): Payer: Self-pay | Admitting: Orthopaedic Surgery

## 2017-01-18 DIAGNOSIS — M659 Synovitis and tenosynovitis, unspecified: Secondary | ICD-10-CM | POA: Insufficient documentation

## 2017-01-18 DIAGNOSIS — M65969 Unspecified synovitis and tenosynovitis, unspecified lower leg: Secondary | ICD-10-CM | POA: Insufficient documentation

## 2017-01-31 ENCOUNTER — Ambulatory Visit (INDEPENDENT_AMBULATORY_CARE_PROVIDER_SITE_OTHER): Payer: Managed Care, Other (non HMO) | Admitting: Orthopaedic Surgery

## 2017-01-31 DIAGNOSIS — M23303 Other meniscus derangements, unspecified medial meniscus, right knee: Secondary | ICD-10-CM

## 2017-01-31 DIAGNOSIS — M1711 Unilateral primary osteoarthritis, right knee: Secondary | ICD-10-CM

## 2017-01-31 NOTE — Progress Notes (Signed)
Patient is status post right knee arthroscopy with partial medial meniscectomy.  He endorses moderate pain at times.  He examined with crutches.  He just started doing the stationary bike.  He takes hydrocodone as needed.  The incisions have healed without signs of infection or drainage.  Sutures removed today.  He has no joint effusion.  His range of motion is 0-90 degrees.  Arthroscopy pictures were reviewed with the patient which does show mainly degenerative joint disease medial compartment and the patellofemoral compartment.  At this point he continues to have relative quad weakness and stiffness of the knee related to the surgery.  He needs to remain out of work so that he can strengthen his quadriceps and improve his range of motion.  I have given him home exercises to help with this.  Follow-up in 4 weeks for recheck.

## 2017-02-28 ENCOUNTER — Ambulatory Visit (INDEPENDENT_AMBULATORY_CARE_PROVIDER_SITE_OTHER): Payer: Managed Care, Other (non HMO) | Admitting: Orthopaedic Surgery

## 2017-02-28 ENCOUNTER — Encounter (INDEPENDENT_AMBULATORY_CARE_PROVIDER_SITE_OTHER): Payer: Self-pay | Admitting: Orthopaedic Surgery

## 2017-02-28 DIAGNOSIS — M23303 Other meniscus derangements, unspecified medial meniscus, right knee: Secondary | ICD-10-CM

## 2017-02-28 DIAGNOSIS — M1711 Unilateral primary osteoarthritis, right knee: Secondary | ICD-10-CM

## 2017-02-28 NOTE — Progress Notes (Signed)
Patient is 6 weeks status post right knee arthroscopy with debridement.  He is feeling much better.  He still has some discomfort with squatting and with walking on concrete floors.  Denies any swelling.  Surgical scars are fully healed.  No joint effusion.  He has good range of motion and decent quad strength.  At this point I would like him to work on quad strengthening and range of motion for 2 more weeks and then he can return back to work as scheduled on March 14, 2017.  Work note was provided today.  Questions encouraged and answered.  Follow-up as needed.

## 2017-03-11 ENCOUNTER — Other Ambulatory Visit: Payer: Self-pay | Admitting: Family Medicine

## 2017-03-12 ENCOUNTER — Telehealth (INDEPENDENT_AMBULATORY_CARE_PROVIDER_SITE_OTHER): Payer: Self-pay | Admitting: Orthopaedic Surgery

## 2017-03-12 ENCOUNTER — Other Ambulatory Visit (INDEPENDENT_AMBULATORY_CARE_PROVIDER_SITE_OTHER): Payer: Self-pay

## 2017-03-12 DIAGNOSIS — M25561 Pain in right knee: Secondary | ICD-10-CM

## 2017-03-12 MED ORDER — NAPROXEN SODIUM 220 MG PO TABS: 220.0000 mg | ORAL_TABLET | Freq: Two times a day (BID) | ORAL | 0 refills | Status: DC

## 2017-03-12 NOTE — Telephone Encounter (Signed)
Left message to find out if he still needs this med for his knee pain as that's why it was prescribed for

## 2017-03-12 NOTE — Telephone Encounter (Signed)
RX SENT TO HIS PHARM PATIENT AWARE.

## 2017-03-12 NOTE — Telephone Encounter (Signed)
Ok to call in a refill.  Make sure he is no longer taking mobic though

## 2017-03-12 NOTE — Telephone Encounter (Signed)
Patient called needing Rx refilled (Naproxen) The number to contact patient is [phone]

## 2017-03-12 NOTE — Telephone Encounter (Signed)
Please advise. See message below.  

## 2017-03-12 NOTE — Telephone Encounter (Signed)
Pt needed this for his knee pain as he just had surgery on it and stll in pain. Pt was advised to contact his ortho that did surgery to get medicine from them as they have taken over his care for knee

## 2017-03-17 ENCOUNTER — Other Ambulatory Visit: Payer: Self-pay | Admitting: Family Medicine

## 2017-03-18 ENCOUNTER — Other Ambulatory Visit: Payer: Self-pay | Admitting: Family Medicine

## 2017-03-18 ENCOUNTER — Telehealth (INDEPENDENT_AMBULATORY_CARE_PROVIDER_SITE_OTHER): Payer: Self-pay | Admitting: Orthopaedic Surgery

## 2017-03-18 ENCOUNTER — Other Ambulatory Visit (INDEPENDENT_AMBULATORY_CARE_PROVIDER_SITE_OTHER): Payer: Self-pay

## 2017-03-18 DIAGNOSIS — M25561 Pain in right knee: Secondary | ICD-10-CM

## 2017-03-18 MED ORDER — NAPROXEN SODIUM 220 MG PO TABS
220.0000 mg | ORAL_TABLET | Freq: Two times a day (BID) | ORAL | 0 refills | Status: DC
Start: 2017-03-18 — End: 2017-04-23

## 2017-03-18 NOTE — Telephone Encounter (Signed)
Message sent in error

## 2017-03-18 NOTE — Telephone Encounter (Signed)
Called patient and advised him to come pick up Rx here in our office.

## 2017-03-18 NOTE — Telephone Encounter (Signed)
Patient came into the clinic stating that the pharmacy does not have his prescription.

## 2017-04-15 ENCOUNTER — Ambulatory Visit (INDEPENDENT_AMBULATORY_CARE_PROVIDER_SITE_OTHER): Payer: Managed Care, Other (non HMO) | Admitting: Orthopaedic Surgery

## 2017-04-15 ENCOUNTER — Encounter (INDEPENDENT_AMBULATORY_CARE_PROVIDER_SITE_OTHER): Payer: Self-pay | Admitting: Orthopaedic Surgery

## 2017-04-15 DIAGNOSIS — M1711 Unilateral primary osteoarthritis, right knee: Secondary | ICD-10-CM

## 2017-04-15 MED ORDER — DICLOFENAC SODIUM 75 MG PO TBEC: 75.0000 mg | DELAYED_RELEASE_TABLET | Freq: Two times a day (BID) | ORAL | 2 refills | Status: DC

## 2017-04-15 MED ORDER — BUPIVACAINE HCL 0.5 % IJ SOLN
2.0000 mL | INTRAMUSCULAR | Status: AC | PRN
  Administered 2017-04-15: 2 mL via INTRA_ARTICULAR

## 2017-04-15 MED ORDER — METHYLPREDNISOLONE ACETATE 40 MG/ML IJ SUSP
40.0000 mg | INTRAMUSCULAR | Status: AC | PRN
  Administered 2017-04-15: 40 mg via INTRA_ARTICULAR

## 2017-04-15 MED ORDER — LIDOCAINE HCL 1 % IJ SOLN
2.0000 mL | INTRAMUSCULAR | Status: AC | PRN
  Administered 2017-04-15: 2 mL

## 2017-04-15 MED ORDER — NAPROXEN 500 MG PO TABS: 500.0000 mg | ORAL_TABLET | Freq: Two times a day (BID) | ORAL | 3 refills | Status: DC

## 2017-04-15 NOTE — Progress Notes (Signed)
Office Visit Note   Patient: Randall Calderon           Date of Birth: 09-Oct-1965           MRN: [id number] Visit Date: 04/15/2017              Requested by: Avanell Shackleton, NP-C [address] [place], [place] [address] PCP: Avanell Shackleton, NP-C   Assessment & Plan: Visit Diagnoses:  1. Unilateral primary osteoarthritis, right knee     Plan: Impression is right knee degenerative joint disease status post knee arthroscopy.  I aspirated 32 cc of joint fluid and then injected with cortisone.  Patient tolerated this well.  Prescription for naproxen and diclofenac.  We did discuss that he will likely need an total knee replacement in the near future.  Questions encouraged and answered.  Follow-up as needed.  Follow-Up Instructions: Return if symptoms worsen or fail to improve.   Orders:  Orders Placed This Encounter  Procedures  . Cell count + diff,  w/ cryst-synvl fld   Meds ordered this encounter  Medications  . diclofenac (VOLTAREN) 75 MG EC tablet    Sig: Take 1 tablet (75 mg total) by mouth 2 (two) times daily.    Dispense:  30 tablet    Refill:  2  . naproxen (NAPROSYN) 500 MG tablet    Sig: Take 1 tablet (500 mg total) by mouth 2 (two) times daily with a meal.    Dispense:  30 tablet    Refill:  3      Procedures: Large Joint Inj: R knee on 04/15/2017 11:02 AM Indications: pain Details: 22 G needle  Arthrogram: No  Medications: 40 mg methylPREDNISolone acetate 40 MG/ML; 2 mL lidocaine 1 %; 2 mL bupivacaine 0.5 % Aspirate: 32 mL clear; sent for lab analysis Outcome: tolerated well, no immediate complications Consent was given by the patient. Patient was prepped and draped in the usual sterile fashion.       Clinical Data: No additional findings.   Subjective: Chief Complaint  Patient presents with  . Right Knee - Follow-up    Patient is almost 3 months status post right knee arthroscopy with debridement.  He is coming in today with pain and  swelling.  Denies any constitutional symptoms.  He takes naproxen with relief.    Review of Systems   Objective: Vital Signs: There were no vitals taken for this visit.  Physical Exam  Ortho Exam Right knee exam shows fully healed surgical scars.  He has normal range of motion. Specialty Comments:  No specialty comments available.  Imaging: No results found.   PMFS History: Patient Active Problem List   Diagnosis Date Noted  . Synovitis of knee 01/18/2017  . Derangement of medial meniscus, right 12/23/2016  . Unilateral primary osteoarthritis, right knee 12/23/2016  . Controlled type 2 diabetes mellitus without complication, without long-term current use of insulin (HCC) 04/03/2016  . BPPV (benign paroxysmal positional vertigo) 04/03/2016  . History of chest pain 04/03/2016  . Chronic right shoulder pain 04/03/2016  . Essential hypertension 04/03/2016   Past Medical History:  Diagnosis Date  . DM type 2 (diabetes mellitus, type 2) (HCC)   . HTN (hypertension)   . Obesity (BMI 30.0-34.9)     History reviewed. No pertinent family history.  Past Surgical History:  Procedure Laterality Date  . KNEE SURGERY     18 years ago  . TONSILLECTOMY     Social History   Occupational History  .  Not on file  Tobacco Use  . Smoking status: Former Smoker    Years: 5.00  . Smokeless tobacco: Never Used  Substance and Sexual Activity  . Alcohol use: No  . 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   Marland Kitchen. Sexual activity: Not on file

## 2017-04-16 LAB — SYNOVIAL CELL COUNT + DIFF, W/ CRYSTALS
Basophils, %: 0 %
Eosinophils-Synovial: 0 % (ref 0–2)
LYMPHOCYTES-SYNOVIAL FLD: 35 % (ref 0–74)
Monocyte/Macrophage: 60 % (ref 0–69)
Neutrophil, Synovial: 3 % (ref 0–24)
SYNOVIOCYTES, %: 2 % (ref 0–15)
WBC, Synovial: 291 cells/uL — ABNORMAL HIGH (ref <150)

## 2017-04-16 LAB — TIQ-NTM

## 2017-04-19 ENCOUNTER — Other Ambulatory Visit: Payer: Self-pay | Admitting: Family Medicine

## 2017-04-21 ENCOUNTER — Other Ambulatory Visit: Payer: Self-pay | Admitting: Family Medicine

## 2017-04-21 NOTE — Telephone Encounter (Signed)
Pt has an appt coming up 

## 2017-04-22 ENCOUNTER — Encounter: Payer: Managed Care, Other (non HMO) | Admitting: Family Medicine

## 2017-04-23 ENCOUNTER — Encounter: Payer: Self-pay | Admitting: Family Medicine

## 2017-04-23 ENCOUNTER — Ambulatory Visit: Payer: Managed Care, Other (non HMO) | Admitting: Family Medicine

## 2017-04-23 VITALS — BP 130/82 | HR 62 | Ht 69.5 in | Wt 226.4 lb

## 2017-04-23 DIAGNOSIS — E6609 Other obesity due to excess calories: Secondary | ICD-10-CM | POA: Diagnosis not present

## 2017-04-23 DIAGNOSIS — Z114 Encounter for screening for human immunodeficiency virus [HIV]: Secondary | ICD-10-CM | POA: Diagnosis not present

## 2017-04-23 DIAGNOSIS — Z79899 Other long term (current) drug therapy: Secondary | ICD-10-CM

## 2017-04-23 DIAGNOSIS — I10 Essential (primary) hypertension: Secondary | ICD-10-CM

## 2017-04-23 DIAGNOSIS — Z6832 Body mass index (BMI) 32.0-32.9, adult: Secondary | ICD-10-CM

## 2017-04-23 DIAGNOSIS — E119 Type 2 diabetes mellitus without complications: Secondary | ICD-10-CM | POA: Diagnosis not present

## 2017-04-23 DIAGNOSIS — Z0000 Encounter for general adult medical examination without abnormal findings: Secondary | ICD-10-CM | POA: Diagnosis not present

## 2017-04-23 LAB — POCT GLYCOSYLATED HEMOGLOBIN (HGB A1C)

## 2017-04-23 LAB — POCT URINALYSIS DIP (PROADVANTAGE DEVICE)
Bilirubin, UA: NEGATIVE
Blood, UA: NEGATIVE
Glucose, UA: NEGATIVE mg/dL
Ketones, POC UA: NEGATIVE mg/dL
LEUKOCYTES UA: NEGATIVE
NITRITE UA: NEGATIVE
PH UA: 6 (ref 5.0–8.0)
PROTEIN UA: NEGATIVE mg/dL
Specific Gravity, Urine: 1.03
Urobilinogen, Ur: NEGATIVE

## 2017-04-23 NOTE — Patient Instructions (Addendum)
Your hemoglobin A1c is 5.9% and your diabetes is well controlled.  Your BMI or body mass index is now in the obese range.  Cut back on calories, mainly carbohydrates. Watch your portion sizes and make sure you are eating healthy foods.  Increase your physical activity as your knee tolerates.   We will call you with your lab results.   Follow up in 6 months for diabetes check.   Preventative Care for Adults, Male       REGULAR HEALTH EXAMS:  A routine yearly physical is a good way to check in with your primary care provider about your health and preventive screening. It is also an opportunity to share updates about your health and any concerns you have, and receive a thorough all-over exam.   Most health insurance companies pay for at least some preventative services.  Check with your health plan for specific coverages.  WHAT PREVENTATIVE SERVICES DO MEN NEED?  Adult men should have their weight and blood pressure checked regularly.   Men age 53 and older should have their cholesterol levels checked regularly.  Beginning at age 53 and continuing to age 53, men should be screened for colorectal cancer.  Certain people should may need continued testing until age 53.  Other cancer screening may include exams for testicular and prostate cancer.  Updating vaccinations is part of preventative care.  Vaccinations help protect against diseases such as the flu.  Lab tests are generally done as part of preventative care to screen for anemia and blood disorders, to screen for problems with the kidneys and liver, to screen for bladder problems, to check blood sugar, and to check your cholesterol level.  Preventative services generally include counseling about diet, exercise, avoiding tobacco, drugs, excessive alcohol consumption, and sexually transmitted infections.    GENERAL RECOMMENDATIONS FOR GOOD HEALTH:  Healthy diet:  Eat a variety of foods, including fruit, vegetables, animal or  vegetable protein, such as meat, fish, chicken, and eggs, or beans, lentils, tofu, and grains, such as rice.  Drink plenty of water daily.  Decrease saturated fat in the diet, avoid lots of red meat, processed foods, sweets, fast foods, and fried foods.  Exercise:  Aerobic exercise helps maintain good heart health. At least 30-40 minutes of moderate-intensity exercise is recommended. For example, a brisk walk that increases your heart rate and breathing. This should be done on most days of the week.   Find a type of exercise or a variety of exercises that you enjoy so that it becomes a part of your daily life.  Examples are running, walking, swimming, water aerobics, and biking.  For motivation and support, explore group exercise such as aerobic class, spin class, Zumba, Yoga,or  martial arts, etc.    Set exercise goals for yourself, such as a certain weight goal, walk or run in a race such as a 5k walk/run.  Speak to your primary care provider about exercise goals.  Disease prevention:  If you smoke or chew tobacco, find out from your caregiver how to quit. It can literally save your life, no matter how long you have been a tobacco user. If you do not use tobacco, never begin.   Maintain a healthy diet and normal weight. Increased weight leads to problems with blood pressure and diabetes.   The Body Mass Index or BMI is a way of measuring how much of your body is fat. Having a BMI above 27 increases the risk of heart disease, diabetes, hypertension, stroke  and other problems related to obesity. Your caregiver can help determine your BMI and based on it develop an exercise and dietary program to help you achieve or maintain this important measurement at a healthful level.  High blood pressure causes heart and blood vessel problems.  Persistent high blood pressure should be treated with medicine if weight loss and exercise do not work.   Fat and cholesterol leaves deposits in your arteries  that can block them. This causes heart disease and vessel disease elsewhere in your body.  If your cholesterol is found to be high, or if you have heart disease or certain other medical conditions, then you may need to have your cholesterol monitored frequently and be treated with medication.   Ask if you should have a stress test if your history suggests this. A stress test is a test done on a treadmill that looks for heart disease. This test can find disease prior to there being a problem.  Avoid drinking alcohol in excess (more than two drinks per day).  Avoid use of street drugs. Do not share needles with anyone. Ask for professional help if you need assistance or instructions on stopping the use of alcohol, cigarettes, and/or drugs.  Brush your teeth twice a day with fluoride toothpaste, and floss once a day. Good oral hygiene prevents tooth decay and gum disease. The problems can be painful, unattractive, and can cause other health problems. Visit your dentist for a routine oral and dental check up and preventive care every 6-12 months.   Look at your skin regularly.  Use a mirror to look at your back. Notify your caregivers of changes in moles, especially if there are changes in shapes, colors, a size larger than a pencil eraser, an irregular border, or development of new moles.  Safety:  Use seatbelts 100% of the time, whether driving or as a passenger.  Use safety devices such as hearing protection if you work in environments with loud noise or significant background noise.  Use safety glasses when doing any work that could send debris in to the eyes.  Use a helmet if you ride a bike or motorcycle.  Use appropriate safety gear for contact sports.  Talk to your caregiver about gun safety.  Use sunscreen with a SPF (or skin protection factor) of 15 or greater.  Lighter skinned people are at a greater risk of skin cancer. Don't forget to also wear sunglasses in order to protect your eyes from too  much damaging sunlight. Damaging sunlight can accelerate cataract formation.   Practice safe sex. Use condoms. Condoms are used for birth control and to help reduce the spread of sexually transmitted infections (or STIs).  Some of the STIs are gonorrhea (the clap), chlamydia, syphilis, trichomonas, herpes, HPV (human papilloma virus) and HIV (human immunodeficiency virus) which causes AIDS. The herpes, HIV and HPV are viral illnesses that have no cure. These can result in disability, cancer and death.   Keep carbon monoxide and smoke detectors in your home functioning at all times. Change the batteries every 6 months or use a model that plugs into the wall.   Vaccinations:  Stay up to date with your tetanus shots and other required immunizations. You should have a booster for tetanus every 10 years. Be sure to get your flu shot every year, since 5%-20% of the [place] population comes down with the flu. The flu vaccine changes each year, so being vaccinated once is not enough. Get your shot in the  fall, before the flu season peaks.   Other vaccines to consider:  Pneumococcal vaccine to protect against certain types of pneumonia.  This is normally recommended for adults age 74 or older.  However, adults younger than 53 years old with certain underlying conditions such as diabetes, heart or lung disease should also receive the vaccine.  Shingles vaccine to protect against Varicella Zoster if you are older than age 91, or younger than 53 years old with certain underlying illness.  Hepatitis A vaccine to protect against a form of infection of the liver by a virus acquired from food.  Hepatitis B vaccine to protect against a form of infection of the liver by a virus acquired from blood or body fluids, particularly if you work in health care.  If you plan to travel internationally, check with your local health department for specific vaccination recommendations.  Cancer Screening:  Most routine colon  cancer screening begins at the age of 50. On a yearly basis, doctors may provide special easy to use take-home tests to check for hidden blood in the stool. Sigmoidoscopy or colonoscopy can detect the earliest forms of colon cancer and is life saving. These tests use a small camera at the end of a tube to directly examine the colon. Speak to your caregiver about this at age 50, when routine screening begins (and is repeated every 5 years unless early forms of pre-cancerous polyps or small growths are found).   At the age of 41 men usually start screening for prostate cancer every year. Screening may begin at a younger age for those with higher risk. Those at higher risk include African-Americans or having a family history of prostate cancer. There are two types of tests for prostate cancer:   Prostate-specific antigen (PSA) testing. Recent studies raise questions about prostate cancer using PSA and you should discuss this with your caregiver.   Digital rectal exam (in which your doctor's lubricated and gloved finger feels for enlargement of the prostate through the anus).   Screening for testicular cancer.  Do a monthly exam of your testicles. Gently roll each testicle between your thumb and fingers, feeling for any abnormal lumps. The best time to do this is after a hot shower or bath when the tissues are looser. Notify your caregivers of any lumps, tenderness or changes in size or shape immediately.

## 2017-04-23 NOTE — Progress Notes (Signed)
Subjective:    Patient ID: Randall Calderon, male    DOB: 1965/02/29, 52 y.o.   MRN: [id number]  HPI Chief Complaint  Patient presents with  . fasting cpe    fasting cpe, DM check, had eye exam done within the last year   He is here for a complete physical exam.  Other providers: Orthopedist- Dr. Roda ShuttersXu  States he is doing well on his current medication regimen and no side effects.  Reports weight gain over the past few months since having knee pain. States he was told he needs a right knee replacement but he is trying to hold off on getting this.   No new medical or surgical issues.   Family history: 14 total siblings. HTN and DM  Social history: Lives with wife and son, works as a Naval architecttruck driver  Diet: he does intermittent fasting but overeats. Diet is not as healthy as in the past. States he is eating the same amount of calories as when he was exercising.  Exercise: nothing in a few months due to knee pain.   Immunizations: up to date. Declines pneumonia vaccine.   Health maintenance:  Colonoscopy: 2 years ago per patient and normal.  Last PSA: done 2 years ago in [place] and does not want it today.  Last Dental Exam: due in spring 2019 Last Eye Exam: June 2018   Wears seatbelt always, uses sunscreen, smoke detectors in home and functioning, does not text while driving, feels safe in home environment.  Reviewed allergies, medications, past medical, surgical, family, and social history.    Review of Systems Review of Systems Constitutional: -fever, -chills, -sweats, -unexpected weight change,-fatigue ENT: -runny nose, -ear pain, -sore throat Cardiology:  -chest pain, -palpitations, -edema Respiratory: -cough, -shortness of breath, -wheezing Gastroenterology: -abdominal pain, -nausea, -vomiting, -diarrhea, -constipation  Hematology: -bleeding or bruising problems Musculoskeletal: -arthralgias, -myalgias, -joint swelling, -back pain Ophthalmology: -vision  changes Urology: -dysuria, -difficulty urinating, -hematuria, -urinary frequency, -urgency Neurology: -headache, -weakness, -tingling, -numbness       Objective:   Physical Exam BP 130/82   Pulse 62   Ht 5' 9.5" (1.765 m)   Wt 226 lb 6.4 oz (102.7 kg)   BMI 32.95 kg/m   General Appearance:    Alert, cooperative, no distress, appears stated age  Head:    Normocephalic, without obvious abnormality, atraumatic  Eyes:    PERRL, conjunctiva/corneas clear, EOM's intact, fundi    benign  Ears:    Normal TM's and external ear canals  Nose:   Nares normal, mucosa normal, no drainage or sinus   tenderness  Throat:   Lips, mucosa, and tongue normal; teeth and gums normal  Neck:   Supple, no lymphadenopathy;  thyroid:  no   enlargement/tenderness/nodules; no carotid   bruit or JVD  Back:    Spine nontender, no curvature, ROM normal, no CVA     tenderness  Lungs:     Clear to auscultation bilaterally without wheezes, rales or     ronchi; respirations unlabored  Chest Wall:    No tenderness or deformity   Heart:    Regular rate and rhythm, S1 and S2 normal, no murmur, rub   or gallop  Breast Exam:    No chest wall tenderness, masses or gynecomastia  Abdomen:     Soft, non-tender, nondistended, normoactive bowel sounds,    no masses, no hepatosplenomegaly  Genitalia:    Declines   Rectal:    Normal sphincter tone, no masses or tenderness; guaiac  negative stool.  Prostate smooth, no nodules, not enlarged.  Extremities:   No clubbing, cyanosis or edema  Pulses:   2+ and symmetric all extremities  Skin:   Skin color, texture, turgor normal, no rashes or lesions  Lymph nodes:   Cervical, supraclavicular, and axillary nodes normal  Neurologic:   CNII-XII intact, normal strength, sensation and gait; reflexes 2+ and symmetric throughout          Psych:   Normal mood, affect, hygiene and grooming.     Urinalysis dipstick:       Assessment & Plan:  Routine general medical examination at a  health care facility - Plan: CBC with Differential/Platelet, Comprehensive metabolic panel, TSH, Lipid panel, POCT Urinalysis DIP (Proadvantage Device)  Controlled type 2 diabetes mellitus without complication, without long-term current use of insulin (HCC) - Plan: HgB A1c, CBC with Differential/Platelet, Comprehensive metabolic panel, TSH  Essential hypertension - Plan: CBC with Differential/Platelet, Comprehensive metabolic panel  Screening for HIV (human immunodeficiency virus) - Plan: HIV antibody  Medication management - Plan: Lipid panel  Class 1 obesity due to excess calories with body mass index (BMI) of 32.0 to 32.9 in adult, unspecified whether serious comorbidity present  Hgb A1c 5.9% and diabetes is well controlled. Continue on Metformin.  Discussed that his BMI places him in the obese category now. Advised to cut back on unhealthy foods, carbohydrates and portion sizes especially since he is not able to exercise currently due to chronic right knee pain.  BP is close to goal. Continue on current medications for this. Diet and exercise discussed.  One time screening for HIV.  Patient is aware that I am doing this due to a deficiency in his electronic medical record and he is okay with it. Fasting lipids checked today.  He appears to be doing fine on atorvastatin.  We will adjust the dose if needed. We discussed pneumonia vaccine today due to diabetes diagnosis.  He would like to hold off on this for now. Patient reports colonoscopy 2 years ago in [place].  States it was normal and he is to follow-up in 10 years.  I will attempt to get this documentation. We discussed PSA screening.  He states he was checked 2 years ago in [place] and he does not wish to have this checked today. Follow-up pending labs or in 6 months for diabetes.  I did offer him to get return in 4 months due to recent weight gain but he prefers to return in 6 months.

## 2017-04-24 ENCOUNTER — Telehealth: Payer: Self-pay | Admitting: Family Medicine

## 2017-04-24 LAB — CBC WITH DIFFERENTIAL/PLATELET
BASOS ABS: 0 10*3/uL (ref 0.0–0.2)
Basos: 1 %
EOS (ABSOLUTE): 0.1 10*3/uL (ref 0.0–0.4)
Eos: 3 %
HEMATOCRIT: 38.1 % (ref 37.5–51.0)
Hemoglobin: 12.7 g/dL — ABNORMAL LOW (ref 13.0–17.7)
Immature Grans (Abs): 0 10*3/uL (ref 0.0–0.1)
Immature Granulocytes: 0 %
LYMPHS ABS: 2.6 10*3/uL (ref 0.7–3.1)
Lymphs: 53 %
MCH: 29.2 pg (ref 26.6–33.0)
MCHC: 33.3 g/dL (ref 31.5–35.7)
MCV: 88 fL (ref 79–97)
MONOS ABS: 0.4 10*3/uL (ref 0.1–0.9)
Monocytes: 9 %
Neutrophils Absolute: 1.6 10*3/uL (ref 1.4–7.0)
Neutrophils: 34 %
PLATELETS: 216 10*3/uL (ref 150–379)
RBC: 4.35 x10E6/uL (ref 4.14–5.80)
RDW: 13.9 % (ref 12.3–15.4)
WBC: 4.8 10*3/uL (ref 3.4–10.8)

## 2017-04-24 LAB — LIPID PANEL
Chol/HDL Ratio: 2.7 ratio (ref 0.0–5.0)
Cholesterol, Total: 125 mg/dL (ref 100–199)
HDL: 46 mg/dL (ref >39)
LDL Calculated: 69 mg/dL (ref 0–99)
Triglycerides: 51 mg/dL (ref 0–149)
VLDL Cholesterol Cal: 10 mg/dL (ref 5–40)

## 2017-04-24 LAB — COMPREHENSIVE METABOLIC PANEL
ALK PHOS: 151 IU/L — AB (ref 39–117)
ALT: 33 IU/L (ref 0–44)
AST: 20 IU/L (ref 0–40)
Albumin/Globulin Ratio: 1.5 (ref 1.2–2.2)
Albumin: 4.3 g/dL (ref 3.5–5.5)
BUN/Creatinine Ratio: 12 (ref 9–20)
BUN: 12 mg/dL (ref 6–24)
Bilirubin Total: 0.2 mg/dL (ref 0.0–1.2)
CO2: 23 mmol/L (ref 20–29)
CREATININE: 1.02 mg/dL (ref 0.76–1.27)
Calcium: 9.3 mg/dL (ref 8.7–10.2)
Chloride: 106 mmol/L (ref 96–106)
GFR calc Af Amer: 97 mL/min/{1.73_m2} (ref >59)
GFR calc non Af Amer: 84 mL/min/{1.73_m2} (ref >59)
Globulin, Total: 2.8 g/dL (ref 1.5–4.5)
Glucose: 120 mg/dL — ABNORMAL HIGH (ref 65–99)
POTASSIUM: 4.5 mmol/L (ref 3.5–5.2)
Sodium: 143 mmol/L (ref 134–144)
Total Protein: 7.1 g/dL (ref 6.0–8.5)

## 2017-04-24 LAB — HIV ANTIBODY (ROUTINE TESTING W REFLEX): HIV SCREEN 4TH GENERATION: NONREACTIVE

## 2017-04-24 LAB — TSH: TSH: 1.12 u[IU]/mL (ref 0.450–4.500)

## 2017-04-24 NOTE — Telephone Encounter (Signed)
Received requested colonoscopy. Sending back for review.  °

## 2017-04-28 ENCOUNTER — Encounter: Payer: Self-pay | Admitting: Internal Medicine

## 2017-04-29 ENCOUNTER — Encounter: Payer: Self-pay | Admitting: Family Medicine

## 2017-05-25 ENCOUNTER — Other Ambulatory Visit: Payer: Self-pay | Admitting: Family Medicine

## 2017-06-18 ENCOUNTER — Other Ambulatory Visit: Payer: Self-pay | Admitting: Family Medicine

## 2017-10-09 ENCOUNTER — Ambulatory Visit (INDEPENDENT_AMBULATORY_CARE_PROVIDER_SITE_OTHER): Payer: Managed Care, Other (non HMO) | Admitting: Family Medicine

## 2017-10-09 ENCOUNTER — Encounter (INDEPENDENT_AMBULATORY_CARE_PROVIDER_SITE_OTHER): Payer: Self-pay | Admitting: Family Medicine

## 2017-10-09 DIAGNOSIS — M25561 Pain in right knee: Secondary | ICD-10-CM

## 2017-10-09 MED ORDER — DICLOFENAC SODIUM 75 MG PO TBEC: 75.0000 mg | DELAYED_RELEASE_TABLET | Freq: Two times a day (BID) | ORAL | 3 refills | Status: DC | PRN

## 2017-10-09 MED ORDER — METHYLPREDNISOLONE ACETATE 40 MG/ML IJ SUSP: 40.0000 mg | Freq: Once | INTRAMUSCULAR | Status: DC

## 2017-10-09 MED ORDER — DICLOFENAC SODIUM 1 % TD GEL: 2.0000 g | Freq: Four times a day (QID) | TRANSDERMAL | 6 refills | Status: DC | PRN

## 2017-10-09 NOTE — Progress Notes (Signed)
   Office Visit Note   Patient: Randall Calderon           Date of Birth: 1965-03-21           MRN: [id number] Visit Date: 10/09/2017 Requested by: Avanell ShackletonHenson, Vickie L, NP-C [address] [place], [place] [id number] PCP: Avanell ShackletonHenson, Vickie L, NP-C  Subjective: Chief Complaint  Patient presents with  . Right Knee - Pain    HPI: He is a 52 year old with right knee pain.  History of DJD, periodic flareups requiring aspiration and injection.  It started hurting a few days ago.  He had arthroscopic surgery last year for meniscus injury.  He uses Voltaren gel with good results, and diclofenac tablets occasionally.              ROS: Otherwise noncontributory, no personal or family history of gout.  Objective: Vital Signs: There were no vitals taken for this visit.  Physical Exam:  Right knee: 1-2+ effusion with no warmth or erythema.  Full extension and flexion of 120 degrees.  Tender on medial and lateral joint lines.  Imaging: None today.  Assessment & Plan: 1.  Right knee effusion with underlying DJD -Discussed options with him and he wants to have it aspirated and injected.  Refilled his medications.  Follow-up as needed.   Follow-Up Instructions: No follow-ups on file.     Procedures: Right knee aspiration injection: After sterile prep with Betadine injected 3 cc 1% lidocaine without epinephrine then aspirated 15 cc of clear yellow synovial fluid, then injected 40 mg methylprednisolone from superior lateral approach.   PMFS History: Patient Active Problem List   Diagnosis Date Noted  . Synovitis of knee 01/18/2017  . Derangement of medial meniscus, right 12/23/2016  . Unilateral primary osteoarthritis, right knee 12/23/2016  . Controlled type 2 diabetes mellitus without complication, without long-term current use of insulin (HCC) 04/03/2016  . BPPV (benign paroxysmal positional vertigo) 04/03/2016  . History of chest pain 04/03/2016  . Chronic right shoulder pain 04/03/2016  .  Essential hypertension 04/03/2016   Past Medical History:  Diagnosis Date  . DM type 2 (diabetes mellitus, type 2) (HCC)   . History of hemorrhoids   . HTN (hypertension)   . Obesity (BMI 30.0-34.9)     Family History  Problem Relation Age of Onset  . Stroke Mother   . Cancer Mother   . Throat cancer Father   . Diabetes Sister   . Hypertension Brother     Past Surgical History:  Procedure Laterality Date  . KNEE SURGERY     18 years ago  . TONSILLECTOMY     Social History   Occupational History  . Not on file  Tobacco Use  . Smoking status: Former Smoker    Years: 5.00  . Smokeless tobacco: Never Used  Substance and Sexual Activity  . Alcohol use: No  . 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   Marland Kitchen. Sexual activity: Not on file

## 2017-10-14 ENCOUNTER — Ambulatory Visit (INDEPENDENT_AMBULATORY_CARE_PROVIDER_SITE_OTHER): Payer: Managed Care, Other (non HMO) | Admitting: Orthopaedic Surgery

## 2017-10-23 NOTE — Progress Notes (Signed)
Subjective:    Patient ID: Randall Calderon, male    DOB: January 11, 1966, 52 y.o.   MRN: [id number]  Randall Calderon is a 52 y.o. male who presents for follow-up of Type 2 diabetes mellitus and other chronic health conditions.  Patient is not checking home blood sugars.   Home blood sugar records: patient does not check sugars How often is blood sugars being checked: none Current symptoms include: none. Patient denies increased appetite, nausea, visual disturbances, vomiting and weight loss.  Patient is checking their feet daily. Any Foot concerns (callous, ulcer, wound, thickened nails, toenail fungus, skin fungus, hammer toe): none Last dilated eye exam: next month  Current treatments: doing well on his DM meds. Metformin only  Medication compliance: good  Current diet: in general, a "healthy" diet   Current exercise: bicycling and weights.  Known diabetic complications: none   HTN- does not check BP at home. Reports good daily compliance of lisinopril without side effects.  Daily statin use without any concerns.   Chronic knee pain- sees ortho for this. Had injection. States he needs a knee replacement. He is currently doing squats, jogging and other high impact exercises. Reports pain and swelling.   Anemia- unclear etiology. Denies bleeding.   The following portions of the patient's history were reviewed and updated as appropriate: allergies, current medications, past medical history, past social history and problem list.  ROS as in subjective above.     Objective:    Physical Exam Alert and in no distress otherwise not examined.  Blood pressure 120/72, pulse (!) 54, weight 217 lb 6.4 oz (98.6 kg).  Lab Review Diabetic Labs Latest Ref Rng & Units 10/24/2017 04/23/2017 10/24/2016 04/03/2016  HbA1c 4.0 - 5.6 % 6.6(A) 5.9% 6.5% 6.5%  Microalbumin mg/dL - - 1.2 -  Micro/Creat Ratio <30 mcg/mg creat - - 4 -  Chol 100 - 199 mg/dL - 125 122 121  HDL >39 mg/dL - 46 47 46  Calc LDL 0 -  99 mg/dL - 69 61 63  Triglycerides 0 - 149 mg/dL - 51 59 58  Creatinine 0.76 - 1.27 mg/dL - 1.02 1.09 1.19   BP/Weight 10/24/2017 04/23/2017 10/24/2016 04/19/1827 10/15/7167  Systolic BP 678 938 101 751 025  Diastolic BP 72 82 84 70 80  Wt. (Lbs) 217.4 226.4 215.2 211.8 211.6  BMI 31.64 32.95 32.48 31.97 31.94   Foot/eye exam completion dates 10/24/2016  Foot Form Completion Done    Randall Calderon  reports that he has quit smoking. He quit after 5.00 years of use. He has never used smokeless tobacco. He reports that he does not drink alcohol or use drugs.     Assessment & Plan:    Controlled type 2 diabetes mellitus without complication, without long-term current use of insulin (Randall Calderon) - Plan: HgB A1c, CBC with Differential/Platelet, Comprehensive metabolic panel, Vitamin E52  Essential hypertension - Plan: CBC with Differential/Platelet, Comprehensive metabolic panel  Needs flu shot - Plan: Flu Vaccine QUAD 36+ mos IM  Chronic pain of right knee  Elevated alkaline phosphatase level - Plan: Iron, TIBC and Ferritin Panel  Anemia, unspecified type - Plan: CBC with Differential/Platelet, Vitamin B12, Folate, Iron, TIBC and Ferritin Panel  1. Rx changes: none discussed that his Hgb A1c is 6.6% which is still well controlled but increased since March.  2. Education: Reviewed 'ABCs' of diabetes management (respective goals in parentheses):  A1C (<7), blood pressure (<130/80), and cholesterol (LDL <100). 3. Compliance at present is estimated to be good. Efforts  to improve compliance (if necessary) will be directed at dietary modifications: continue to eat low carbohydrates and sugar, increased exercise and regular blood sugar monitoring: 2-3 times weekly. 4. HTN- BP is in goal range. Continue lisinopril. No side effects.  5. Elevated alk phos- recheck this today. Discussed cutting back on his daily use of NSAIDs 6. Anemia-mild. Check CBC and iron studies. Colonoscopy UTD.  7. Chronic knee pain- continue  seeing orthopedist. Advised to cut back on NSAIDs by doing low impact exercises, using ice, rest when inflamed and topical analgesic. Discussed potential complication of kidneys and GI tract with daily NSAID use.  8. Flu shot given.  9. Follow up: 6 months or sooner if blood sugars are not controlled. He was given a meter and supplies today.

## 2017-10-24 ENCOUNTER — Ambulatory Visit: Payer: Managed Care, Other (non HMO) | Admitting: Family Medicine

## 2017-10-24 ENCOUNTER — Encounter: Payer: Self-pay | Admitting: Family Medicine

## 2017-10-24 VITALS — BP 120/72 | HR 54 | Wt 217.4 lb

## 2017-10-24 DIAGNOSIS — R748 Abnormal levels of other serum enzymes: Secondary | ICD-10-CM

## 2017-10-24 DIAGNOSIS — M25561 Pain in right knee: Secondary | ICD-10-CM | POA: Diagnosis not present

## 2017-10-24 DIAGNOSIS — E119 Type 2 diabetes mellitus without complications: Secondary | ICD-10-CM

## 2017-10-24 DIAGNOSIS — I10 Essential (primary) hypertension: Secondary | ICD-10-CM | POA: Diagnosis not present

## 2017-10-24 DIAGNOSIS — D649 Anemia, unspecified: Secondary | ICD-10-CM

## 2017-10-24 DIAGNOSIS — Z23 Encounter for immunization: Secondary | ICD-10-CM | POA: Diagnosis not present

## 2017-10-24 DIAGNOSIS — G8929 Other chronic pain: Secondary | ICD-10-CM

## 2017-10-24 LAB — POCT GLYCOSYLATED HEMOGLOBIN (HGB A1C): Hemoglobin A1C: 6.6 % — AB (ref 4.0–5.6)

## 2017-10-24 MED ORDER — ONETOUCH DELICA LANCETS FINE MISC: 1 refills | Status: DC

## 2017-10-24 MED ORDER — BLOOD GLUCOSE TEST VI STRP: ORAL_STRIP | 1 refills | Status: DC

## 2017-10-24 NOTE — Patient Instructions (Addendum)
Your hemoglobin A1c is 6.6% today. This is well controlled diabetes but higher than last visit.  Continue on the metformin, eating healthy and getting at least 150 minutes of physical activity per week.   Start checking your blood sugar in the mornings a couple of days per week. Goal fasting blood sugar is 80-130. Never check right after eating. You may check 2 hours after a meal and the goal is 130-160.  Keep a record of your readings and bring them in to your next visit.   Get a diabetic eye exam in the next month or two. This may be covered by your health insurance since you have diabetes.    Your BP is 120/72 and in goal range. Continue on your medications.   Choose low impact exercises such as cycling, elliptical, swimming. Do the quad strengthening exercises we discussed.  Rest your knee when it is inflamed. Use ice after workouts. Topical Voltaren is fine but try to cut back on the oral antiinflammatories.   We will call you with your lab results.   Follow up in 6 months or sooner if your blood sugars are not in goal range on a regular basis.

## 2017-10-25 LAB — CBC WITH DIFFERENTIAL/PLATELET
Basophils Absolute: 0 10*3/uL (ref 0.0–0.2)
Basos: 1 %
EOS (ABSOLUTE): 0.2 10*3/uL (ref 0.0–0.4)
Eos: 5 %
HEMATOCRIT: 37.3 % — AB (ref 37.5–51.0)
Hemoglobin: 12.2 g/dL — ABNORMAL LOW (ref 13.0–17.7)
IMMATURE GRANS (ABS): 0 10*3/uL (ref 0.0–0.1)
IMMATURE GRANULOCYTES: 0 %
LYMPHS: 51 %
Lymphocytes Absolute: 2.3 10*3/uL (ref 0.7–3.1)
MCH: 28.4 pg (ref 26.6–33.0)
MCHC: 32.7 g/dL (ref 31.5–35.7)
MCV: 87 fL (ref 79–97)
MONOCYTES: 11 %
MONOS ABS: 0.5 10*3/uL (ref 0.1–0.9)
Neutrophils Absolute: 1.4 10*3/uL (ref 1.4–7.0)
Neutrophils: 32 %
Platelets: 171 10*3/uL (ref 150–450)
RBC: 4.3 x10E6/uL (ref 4.14–5.80)
RDW: 13.1 % (ref 12.3–15.4)
WBC: 4.4 10*3/uL (ref 3.4–10.8)

## 2017-10-25 LAB — COMPREHENSIVE METABOLIC PANEL
ALT: 41 IU/L (ref 0–44)
AST: 27 IU/L (ref 0–40)
Albumin/Globulin Ratio: 1.7 (ref 1.2–2.2)
Albumin: 4.5 g/dL (ref 3.5–5.5)
Alkaline Phosphatase: 140 IU/L — ABNORMAL HIGH (ref 39–117)
BUN/Creatinine Ratio: 17 (ref 9–20)
BUN: 20 mg/dL (ref 6–24)
Bilirubin Total: 0.4 mg/dL (ref 0.0–1.2)
CALCIUM: 9.7 mg/dL (ref 8.7–10.2)
CO2: 23 mmol/L (ref 20–29)
Chloride: 101 mmol/L (ref 96–106)
Creatinine, Ser: 1.21 mg/dL (ref 0.76–1.27)
GFR calc Af Amer: 79 mL/min/{1.73_m2} (ref >59)
GFR, EST NON AFRICAN AMERICAN: 68 mL/min/{1.73_m2} (ref >59)
GLOBULIN, TOTAL: 2.6 g/dL (ref 1.5–4.5)
Glucose: 104 mg/dL — ABNORMAL HIGH (ref 65–99)
Potassium: 4.3 mmol/L (ref 3.5–5.2)
Sodium: 143 mmol/L (ref 134–144)
TOTAL PROTEIN: 7.1 g/dL (ref 6.0–8.5)

## 2017-10-25 LAB — IRON,TIBC AND FERRITIN PANEL
Ferritin: 186 ng/mL (ref 30–400)
IRON SATURATION: 36 % (ref 15–55)
Iron: 101 ug/dL (ref 38–169)
TIBC: 284 ug/dL (ref 250–450)
UIBC: 183 ug/dL (ref 111–343)

## 2017-10-25 LAB — VITAMIN B12: Vitamin B-12: 673 pg/mL (ref 232–1245)

## 2017-10-25 LAB — FOLATE

## 2017-10-29 ENCOUNTER — Encounter: Payer: Self-pay | Admitting: Family Medicine

## 2017-10-29 DIAGNOSIS — D649 Anemia, unspecified: Secondary | ICD-10-CM

## 2017-10-29 DIAGNOSIS — R748 Abnormal levels of other serum enzymes: Secondary | ICD-10-CM

## 2017-10-29 HISTORY — DX: Anemia, unspecified: D64.9

## 2017-10-29 HISTORY — DX: Abnormal levels of other serum enzymes: R74.8

## 2017-11-07 ENCOUNTER — Encounter: Payer: Self-pay | Admitting: Family Medicine

## 2017-11-07 ENCOUNTER — Ambulatory Visit: Payer: Managed Care, Other (non HMO) | Admitting: Family Medicine

## 2017-11-07 VITALS — BP 130/80 | HR 61 | Temp 98.3°F | Resp 16 | Ht 69.0 in | Wt 217.2 lb

## 2017-11-07 DIAGNOSIS — R21 Rash and other nonspecific skin eruption: Secondary | ICD-10-CM | POA: Diagnosis not present

## 2017-11-07 DIAGNOSIS — Z113 Encounter for screening for infections with a predominantly sexual mode of transmission: Secondary | ICD-10-CM

## 2017-11-07 NOTE — Progress Notes (Signed)
   Subjective:    Patient ID: Randall Calderon, male    DOB: 06-03-1965, 52 y.o.   MRN: [id number]  HPI Chief Complaint  Patient presents with  . rash    rash on private area X 2 days   He is here with complaints of bumps on his penis for the past 2-3 days. The rash started as linear vesicles and he has a picture on his phone to show me this.  States he has had 3-4 similar outbreaks over the past few months. States the rash always starts with blisters and he has tingling and tenderness to the area. States then the rash dries up and becomes scabbed usually.  Denies history of herpes infection.   Denies fever, chills, body aches, abdominal pain, back pain, N/V/D, urinary symptoms, penile discharge or testicle pain.   Reviewed allergies, medications, past medical, surgical, family, and social history.   Review of Systems Pertinent positives and negatives in the history of present illness.     Objective:   Physical Exam BP 130/80   Pulse 61   Temp 98.3 F (36.8 C) (Oral)   Resp 16   Ht 5\' 9"  (1.753 m)   Wt 217 lb 3.2 oz (98.5 kg)   SpO2 96%   BMI 32.07 kg/m   Alert and oriented an in no acute distress. Genital exam shows a circumcised penis with healing flesh colored papules that appear dry to the underneath area of shaft without edema, erythema or tenderness. Normal appearing exam otherwise.       Assessment & Plan:  Rash of penis - Plan: RPR, GC/Chlamydia Probe Amp, HIV Antibody (routine testing w rflx), HSV(herpes simplex vrs) 1+2 ab-IgM, Trichomonas vaginalis, RNA, CANCELED: Trichomonas vaginalis, RNA  Screen for STD (sexually transmitted disease) - Plan: RPR, GC/Chlamydia Probe Amp, HIV Antibody (routine testing w rflx), HSV(herpes simplex vrs) 1+2 ab-IgM, Trichomonas vaginalis, RNA, CANCELED: Trichomonas vaginalis, RNA  Discussed that the outbreak is suspicious for genital herpes. Will check STD panel including HSV serology. Dicussed that it would be best for him to return  when outbreak occurs in order to do a culture of the fluid filled vesicles but we cannot do this today since they have resolved. No medication prescribed. Advised that if this is herpes, the contagious nature and to use condoms.  Follow up pending labs.

## 2017-11-10 LAB — TRICHOMONAS VAGINALIS, PROBE AMP: TRICH VAG BY NAA: NEGATIVE

## 2017-11-10 LAB — GC/CHLAMYDIA PROBE AMP
CHLAMYDIA, DNA PROBE: NEGATIVE
NEISSERIA GONORRHOEAE BY PCR: NEGATIVE

## 2017-11-11 ENCOUNTER — Other Ambulatory Visit: Payer: Self-pay | Admitting: Family Medicine

## 2017-11-11 ENCOUNTER — Encounter: Payer: Self-pay | Admitting: Family Medicine

## 2017-11-11 DIAGNOSIS — B009 Herpesviral infection, unspecified: Secondary | ICD-10-CM | POA: Insufficient documentation

## 2017-11-11 HISTORY — DX: Herpesviral infection, unspecified: B00.9

## 2017-11-11 LAB — RPR: RPR Ser Ql: NONREACTIVE

## 2017-11-11 LAB — HSV(HERPES SIMPLEX VRS) I + II AB-IGM: HSVI/II COMB AB IGM: 1.24 ratio — AB (ref 0.00–0.90)

## 2017-11-11 LAB — HIV ANTIBODY (ROUTINE TESTING W REFLEX): HIV SCREEN 4TH GENERATION: NONREACTIVE

## 2017-11-11 MED ORDER — VALACYCLOVIR HCL 1 G PO TABS: 1000.0000 mg | ORAL_TABLET | Freq: Every day | ORAL | 2 refills | Status: DC

## 2017-11-29 LAB — HM DIABETES EYE EXAM

## 2017-12-01 ENCOUNTER — Encounter: Payer: Self-pay | Admitting: Family Medicine

## 2017-12-02 ENCOUNTER — Encounter: Payer: Self-pay | Admitting: Family Medicine

## 2017-12-02 ENCOUNTER — Other Ambulatory Visit: Payer: Self-pay | Admitting: Family Medicine

## 2017-12-02 MED ORDER — METFORMIN HCL ER 500 MG PO TB24: 500.0000 mg | ORAL_TABLET | Freq: Every evening | ORAL | 5 refills | Status: DC

## 2017-12-14 ENCOUNTER — Other Ambulatory Visit: Payer: Self-pay | Admitting: Family Medicine

## 2017-12-23 ENCOUNTER — Encounter: Payer: Self-pay | Admitting: Family Medicine

## 2017-12-23 MED ORDER — LISINOPRIL 10 MG PO TABS: 10.0000 mg | ORAL_TABLET | Freq: Every day | ORAL | 1 refills | Status: DC

## 2018-04-15 ENCOUNTER — Encounter: Payer: Self-pay | Admitting: Family Medicine

## 2018-04-15 ENCOUNTER — Encounter: Payer: Self-pay | Admitting: Medical

## 2018-04-15 ENCOUNTER — Ambulatory Visit: Payer: Managed Care, Other (non HMO) | Admitting: Medical

## 2018-04-15 VITALS — BP 120/90 | HR 53 | Temp 98.2°F | Resp 16 | Ht 69.0 in | Wt 214.4 lb

## 2018-04-15 DIAGNOSIS — L244 Irritant contact dermatitis due to drugs in contact with skin: Secondary | ICD-10-CM | POA: Diagnosis not present

## 2018-04-15 MED ORDER — PREDNISONE 20 MG PO TABS: ORAL_TABLET | ORAL | 0 refills | Status: DC

## 2018-04-15 MED ORDER — HYDROCORTISONE 2.5 % EX CREA: TOPICAL_CREAM | Freq: Two times a day (BID) | CUTANEOUS | 0 refills | Status: DC

## 2018-04-15 NOTE — Patient Instructions (Addendum)
Contact dermatitis/allergic reaction to beard dye  don't use any more of that dye  Speak to your barber about other options in the future if you still want to dye the beard.  just don't use that same dye or the ingredients in the dye that was used  Don't use any type of coloring product for the next 1.5 weeks or more to give this a chance to resolve  You can use either Benadryl or a less sedating antihistamine such as Claritin over the counter 1 or 2 times daily the next 3-4 days for allergic reaction, itching and burning  Begin prednisone steroid tablet by mouth as directed x 3 days  The prednisone will increase your blood sugars, so be estra careful with diet avoiding high sugar foods this week  Begin Hydrocortisone topica cream in the bear area for the next 7 days.  Don't use this more than 12 days, but let me know if the irritation and rash doesn't completley go away within the next week  Avoid shaving until the rash and irritation is gone  Avoid alcohol topically to the rash  If signs of worse redness, pain, pus drainage, fever, or other signs of infection, call or return right away

## 2018-04-15 NOTE — Progress Notes (Addendum)
Subjective:     Patient ID: Randall Calderon, male   DOB: November 03, 1965, 53 y.o.   MRN: [id number]  HPI Chief Complaint  Patient presents with  . allergic reaction    reaction from coloring beard, itchy, swollen, red X Monday   Here for reaction to dye.  His barber used a dye in the beard area 3 days ago.  then within the next day had irritation, redness, bumps and itching.  Continues to have burning and itching particularly within the shower.  He has tried using topical alcohol gel and Neosporin.  No improvement.  He is a diabetic but his sugars run good.  He is a [employer].  No fever, no illness symptoms.  No other complaint  Review of Systems As in subjective    Objective:   Physical Exam BP 120/90   Pulse (!) 53   Temp 98.2 F (36.8 C) (Oral)   Resp 16   Ht 5\' 9"  (1.753 m)   Wt 214 lb 6.4 oz (97.3 kg)   SpO2 97%   BMI 31.66 kg/m   Gen: wd, wn, nad Skin: He has a trimmed down full beard and goatee.  He has irritated papular bumpy skin throughout the entire beard area with some erythema and a few little small areas of serous drainage.  No induration, no fluctuance, no warmth. He has a few shotty anterior lymph nodes.  Otherwise no lymphadenopathy no thyromegaly no mass or other abnormalities of the neck No other skin findings of concern HEENT otherwise unremarkable    Assessment:     Encounter Diagnosis  Name Primary?  . Irritant contact dermatitis due to drug in contact with skin Yes       Plan:     Discussed symptoms and exam findings, treatment recommendations below.  We discussed medication risks with elevated glucose with prednisone, precautions.  Patient Instructions  Contact dermatitis/allergic reaction to beard dye  don't use any more of that dye  Speak to your barber about other options in the future if you still want to dye the beard.  just don't use that same dye or the ingredients in the dye that was used  Don't use any type of coloring product for  the next 1.5 weeks or more to give this a chance to resolve  You can use either Benadryl or a less sedating antihistamine such as Claritin over the counter 1 or 2 times daily the next 3-4 days for allergic reaction, itching and burning  Begin prednisone steroid tablet by mouth as directed x 3 days  The prednisone will increase your blood sugars, so be estra careful with diet avoiding high sugar foods this week  Begin Hydrocortisone topica cream in the bear area for the next 7 days.  Don't use this more than 12 days, but let me know if the irritation and rash doesn't completley go away within the next week  Avoid shaving until the rash and irritation is gone  Avoid alcohol topically to the rash  If signs of worse redness, pain, pus drainage, fever, or other signs of infection, call or return right away   Randall Calderon was seen today for allergic reaction.  Diagnoses and all orders for this visit:  Irritant contact dermatitis due to drug in contact with skin  Other orders -     predniSONE (DELTASONE) 20 MG tablet; 3 tablets today, 2 tablets tomorrow, 1 tablet the third day -     hydrocortisone 2.5 % cream; Apply topically 2 (two)  times daily.

## 2018-04-27 ENCOUNTER — Encounter: Payer: Self-pay | Admitting: Family Medicine

## 2018-04-27 ENCOUNTER — Other Ambulatory Visit: Payer: Self-pay

## 2018-04-27 ENCOUNTER — Ambulatory Visit: Payer: Managed Care, Other (non HMO) | Admitting: Family Medicine

## 2018-04-27 VITALS — BP 120/70 | HR 64 | Ht 69.0 in | Wt 212.2 lb

## 2018-04-27 DIAGNOSIS — D649 Anemia, unspecified: Secondary | ICD-10-CM

## 2018-04-27 DIAGNOSIS — Z23 Encounter for immunization: Secondary | ICD-10-CM | POA: Diagnosis not present

## 2018-04-27 DIAGNOSIS — E119 Type 2 diabetes mellitus without complications: Secondary | ICD-10-CM

## 2018-04-27 DIAGNOSIS — Z0000 Encounter for general adult medical examination without abnormal findings: Secondary | ICD-10-CM | POA: Diagnosis not present

## 2018-04-27 DIAGNOSIS — Z79899 Other long term (current) drug therapy: Secondary | ICD-10-CM | POA: Diagnosis not present

## 2018-04-27 DIAGNOSIS — K649 Unspecified hemorrhoids: Secondary | ICD-10-CM | POA: Diagnosis not present

## 2018-04-27 DIAGNOSIS — I10 Essential (primary) hypertension: Secondary | ICD-10-CM

## 2018-04-27 LAB — POCT URINALYSIS DIP (PROADVANTAGE DEVICE)
Bilirubin, UA: NEGATIVE
Blood, UA: NEGATIVE
Glucose, UA: NEGATIVE mg/dL
Ketones, POC UA: NEGATIVE mg/dL
Leukocytes, UA: NEGATIVE
Nitrite, UA: NEGATIVE
PH UA: 6 (ref 5.0–8.0)
Protein Ur, POC: NEGATIVE mg/dL
Specific Gravity, Urine: 1.02
UUROB: NEGATIVE

## 2018-04-27 LAB — POCT GLYCOSYLATED HEMOGLOBIN (HGB A1C): HEMOGLOBIN A1C: 6.2 % — AB (ref 4.0–5.6)

## 2018-04-27 NOTE — Patient Instructions (Addendum)
Your hemoglobin A1c is 6.2% and blood sugars are well controlled.  Continue on a healthy diet and with your exercise regimen. Your blood pressure is in goal range today as well. Continue on all your current medications.     Preventive Care 40-64 Years, Male Preventive care refers to lifestyle choices and visits with your health care provider that can promote health and wellness. What does preventive care include?   A yearly physical exam. This is also called an annual well check.  Dental exams once or twice a year.  Routine eye exams. Ask your health care provider how often you should have your eyes checked.  Personal lifestyle choices, including: ? Daily care of your teeth and gums. ? Regular physical activity. ? Eating a healthy diet. ? Avoiding tobacco and drug use. ? Limiting alcohol use. ? Practicing safe sex. ? Taking low-dose aspirin every day starting at age 53. What happens during an annual well check? The services and screenings done by your health care provider during your annual well check will depend on your age, overall health, lifestyle risk factors, and family history of disease. Counseling Your health care provider may ask you questions about your:  Alcohol use.  Tobacco use.  Drug use.  Emotional well-being.  Home and relationship well-being.  Sexual activity.  Eating habits.  Work and work Statistician. Screening You may have the following tests or measurements:  Height, weight, and BMI.  Blood pressure.  Lipid and cholesterol levels. These may be checked every 5 years, or more frequently if you are over 83 years old.  Skin check.  Lung cancer screening. You may have this screening every year starting at age 62 if you have a 30-pack-year history of smoking and currently smoke or have quit within the past 15 years.  Colorectal cancer screening. All adults should have this screening starting at age 50 and continuing until age 46. Your health  care provider may recommend screening at age 27. You will have tests every 1-10 years, depending on your results and the type of screening test. People at increased risk should start screening at an earlier age. Screening tests may include: ? Guaiac-based fecal occult blood testing. ? Fecal immunochemical test (FIT). ? Stool DNA test. ? Virtual colonoscopy. ? Sigmoidoscopy. During this test, a flexible tube with a tiny camera (sigmoidoscope) is used to examine your rectum and lower colon. The sigmoidoscope is inserted through your anus into your rectum and lower colon. ? Colonoscopy. During this test, a long, thin, flexible tube with a tiny camera (colonoscope) is used to examine your entire colon and rectum.  Prostate cancer screening. Recommendations will vary depending on your family history and other risks.  Hepatitis C blood test.  Hepatitis B blood test.  Sexually transmitted disease (STD) testing.  Diabetes screening. This is done by checking your blood sugar (glucose) after you have not eaten for a while (fasting). You may have this done every 1-3 years. Discuss your test results, treatment options, and if necessary, the need for more tests with your health care provider. Vaccines Your health care provider may recommend certain vaccines, such as:  Influenza vaccine. This is recommended every year.  Tetanus, diphtheria, and acellular pertussis (Tdap, Td) vaccine. You may need a Td booster every 10 years.  Varicella vaccine. You may need this if you have not been vaccinated.  Zoster vaccine. You may need this after age 34.  Measles, mumps, and rubella (MMR) vaccine. You may need at least one dose  of MMR if you were born in 1957 or later. You may also need a second dose.  Pneumococcal 13-valent conjugate (PCV13) vaccine. You may need this if you have certain conditions and have not been vaccinated.  Pneumococcal polysaccharide (PPSV23) vaccine. You may need one or two doses if  you smoke cigarettes or if you have certain conditions.  Meningococcal vaccine. You may need this if you have certain conditions.  Hepatitis A vaccine. You may need this if you have certain conditions or if you travel or work in places where you may be exposed to hepatitis A.  Hepatitis B vaccine. You may need this if you have certain conditions or if you travel or work in places where you may be exposed to hepatitis B.  Haemophilus influenzae type b (Hib) vaccine. You may need this if you have certain risk factors. Talk to your health care provider about which screenings and vaccines you need and how often you need them. This information is not intended to replace advice given to you by your health care provider. Make sure you discuss any questions you have with your health care provider. Document Released: 02/24/2015 Document Revised: 03/20/2017 Document Reviewed: 11/29/2014 Elsevier Interactive Patient Education  2019 [employer].

## 2018-04-27 NOTE — Progress Notes (Signed)
Subjective:    Patient ID: Randall Calderon, male    DOB: Jul 30, 1965, 53 y.o.   MRN: [id number]  HPI Chief Complaint  Patient presents with  . fasting cpe    fasting cpe   He is here for a complete physical exam and follow up on chronic health conditions.   Diabetes-previous hemoglobin A1c 6.6% reports he is eating healthy  Checks BS at home. No low readings and no readings higher than 160s.  Doing fine on medication  Hypertension-does not check this at home.  Reports good daily compliance with lisinopril and no concerns.  Reports taking daily atorvastatin and no side effects.  Hemorrhoids- history of hemorrhoids with intermittent flares. Uses OTC treatment. States recently he has been having more pain and swelling. He drives a truck for work. Denies seeing anyone in the past for this.   History of anemia.  Other providers: Dr. Roda Shutters - orthopedist    Social history: Lives with wife and son who is 91, works as  Denies smoking, drinking alcohol, drug use Diet: healthy  Exercise: 6 days a week   Immunizations: Zostavax, Tdap and flu UTD. Needs pneumonia vaccine   Health maintenance:  Colonoscopy: normal in 2016 in [place]. Recall in 2026  Last PSA: 3 years ago or longer  Last Dental Exam: overdue  Last Eye Exam: UTD   Wears seatbelt always, smoke detectors in home and functioning, does not text while driving, feels safe in home environment.  Reviewed allergies, medications, past medical, surgical, family, and social history.   Review of Systems Review of Systems Constitutional: -fever, -chills, -sweats, -unexpected weight change,-fatigue ENT: -runny nose, -ear pain, -sore throat Cardiology:  -chest pain, -palpitations, -edema Respiratory: -cough, -shortness of breath, -wheezing Gastroenterology: -abdominal pain, -nausea, -vomiting, -diarrhea, -constipation  Hematology: -bleeding or bruising problems Musculoskeletal: -arthralgias, -myalgias, -joint swelling, -back pain  Ophthalmology: -vision changes Urology: -dysuria, -difficulty urinating, -hematuria, -urinary frequency, -urgency Neurology: -headache, -weakness, -tingling, -numbness       Objective:   Physical Exam BP 120/70   Pulse 64   Ht 5\' 9"  (1.753 m)   Wt 212 lb 3.2 oz (96.3 kg)   SpO2 97%   BMI 31.34 kg/m   General Appearance:    Alert, cooperative, no distress, appears stated age  Head:    Normocephalic, without obvious abnormality, atraumatic  Eyes:    PERRL, conjunctiva/corneas clear, EOM's intact, fundi    benign  Ears:    Normal TM's and external ear canals  Nose:   Nares normal, mucosa normal, no drainage or sinus   tenderness  Throat:   Lips, mucosa, and tongue normal; teeth and gums normal  Neck:   Supple, no lymphadenopathy;  thyroid:  no   enlargement/tenderness/nodules; no carotid   bruit or JVD  Back:    Spine nontender, no curvature, ROM normal, no CVA     tenderness  Lungs:     Clear to auscultation bilaterally without wheezes, rales or     ronchi; respirations unlabored  Chest Wall:    No tenderness or deformity   Heart:    Regular rate and rhythm, S1 and S2 normal, no murmur, rub   or gallop  Breast Exam:    No chest wall tenderness, masses or gynecomastia  Abdomen:     Soft, non-tender, nondistended, normoactive bowel sounds,    no masses, no hepatosplenomegaly  Genitalia:   Declines.  Rectal:    Normal sphincter tone, thrombosed hemorrhoid is present guaiac negative stool.  Prostate smooth,  no nodules, not enlarged.  Extremities:   No clubbing, cyanosis or edema  Pulses:   2+ and symmetric all extremities  Skin:   Skin color, texture, turgor normal, no rashes or lesions  Lymph nodes:   Cervical, supraclavicular, and axillary nodes normal  Neurologic:   CNII-XII intact, normal strength, sensation and gait; reflexes 2+ and symmetric throughout          Psych:   Normal mood, affect, hygiene and grooming.     Urinalysis dipstick: negative       Assessment &  Plan:  Routine general medical examination at a health care facility - Plan: POCT Urinalysis DIP (Proadvantage Device), HgB A1c, CBC with Differential/Platelet, Comprehensive metabolic panel, TSH, T4, free, Lipid panel  Controlled type 2 diabetes mellitus without complication, without long-term current use of insulin (HCC) - Plan: HgB A1c, Microalbumin/Creatinine Ratio, Urine, TSH, T4, free, Lipid panel  Hemorrhoids, unspecified hemorrhoid type - Plan: Ambulatory referral to Gastroenterology  Anemia, unspecified type  Need for vaccination against Streptococcus pneumoniae - Plan: Pneumococcal conjugate vaccine 13-valent  Medication management - Plan: Lipid panel  Essential hypertension  Hgb A1c 6.2% and his diabetes is well controlled.  Continue on current metformin. He will check blood sugars sporadically. Blood pressure is well controlled.  Continue medication. Continue on daily statin. History of anemia.  Recheck CBC.  Referral to GI for hemorrhoids.  He may also need evaluation for anemia if this is persistent and unexplained. Prevnar 13 was given.  He appears to be up-to-date otherwise Follow-up pending labs

## 2018-04-28 ENCOUNTER — Encounter: Payer: Managed Care, Other (non HMO) | Admitting: Family Medicine

## 2018-04-28 LAB — COMPREHENSIVE METABOLIC PANEL WITH GFR
ALT: 29 IU/L (ref 0–44)
AST: 19 IU/L (ref 0–40)
Albumin/Globulin Ratio: 1.6 (ref 1.2–2.2)
Albumin: 4.7 g/dL (ref 3.8–4.9)
Alkaline Phosphatase: 147 IU/L — ABNORMAL HIGH (ref 39–117)
BUN/Creatinine Ratio: 10 (ref 9–20)
BUN: 13 mg/dL (ref 6–24)
Bilirubin Total: 0.3 mg/dL (ref 0.0–1.2)
CO2: 22 mmol/L (ref 20–29)
Calcium: 9.7 mg/dL (ref 8.7–10.2)
Chloride: 101 mmol/L (ref 96–106)
Creatinine, Ser: 1.27 mg/dL (ref 0.76–1.27)
GFR calc Af Amer: 74 mL/min/1.73
GFR calc non Af Amer: 64 mL/min/1.73
Globulin, Total: 2.9 g/dL (ref 1.5–4.5)
Glucose: 110 mg/dL — ABNORMAL HIGH (ref 65–99)
Potassium: 4.6 mmol/L (ref 3.5–5.2)
Sodium: 138 mmol/L (ref 134–144)
Total Protein: 7.6 g/dL (ref 6.0–8.5)

## 2018-04-28 LAB — MICROALBUMIN / CREATININE URINE RATIO
Creatinine, Urine: 182 mg/dL
Microalb/Creat Ratio: 3 mg/g creat (ref 0–29)
Microalbumin, Urine: 5.4 ug/mL

## 2018-04-28 LAB — CBC WITH DIFFERENTIAL/PLATELET
Basophils Absolute: 0 x10E3/uL (ref 0.0–0.2)
Basos: 1 %
EOS (ABSOLUTE): 0.2 x10E3/uL (ref 0.0–0.4)
Eos: 4 %
Hematocrit: 42.4 % (ref 37.5–51.0)
Hemoglobin: 14.2 g/dL (ref 13.0–17.7)
Immature Grans (Abs): 0 x10E3/uL (ref 0.0–0.1)
Immature Granulocytes: 0 %
Lymphocytes Absolute: 2.4 x10E3/uL (ref 0.7–3.1)
Lymphs: 55 %
MCH: 28.6 pg (ref 26.6–33.0)
MCHC: 33.5 g/dL (ref 31.5–35.7)
MCV: 85 fL (ref 79–97)
Monocytes Absolute: 0.5 x10E3/uL (ref 0.1–0.9)
Monocytes: 11 %
Neutrophils Absolute: 1.3 x10E3/uL — ABNORMAL LOW (ref 1.4–7.0)
Neutrophils: 29 %
Platelets: 223 x10E3/uL (ref 150–450)
RBC: 4.97 x10E6/uL (ref 4.14–5.80)
RDW: 12.6 % (ref 11.6–15.4)
WBC: 4.3 x10E3/uL (ref 3.4–10.8)

## 2018-04-28 LAB — LIPID PANEL
Chol/HDL Ratio: 3.2 ratio (ref 0.0–5.0)
Cholesterol, Total: 149 mg/dL (ref 100–199)
HDL: 47 mg/dL (ref >39)
LDL Calculated: 89 mg/dL (ref 0–99)
TRIGLYCERIDES: 66 mg/dL (ref 0–149)
VLDL Cholesterol Cal: 13 mg/dL (ref 5–40)

## 2018-04-28 LAB — TSH: TSH: 1.22 u[IU]/mL (ref 0.450–4.500)

## 2018-04-28 LAB — T4, FREE: Free T4: 1.12 ng/dL (ref 0.82–1.77)

## 2018-06-10 ENCOUNTER — Other Ambulatory Visit: Payer: Self-pay | Admitting: Family Medicine

## 2018-06-10 ENCOUNTER — Encounter: Payer: Self-pay | Admitting: Family Medicine

## 2018-06-10 MED ORDER — METFORMIN HCL ER 500 MG PO TB24: 500.0000 mg | ORAL_TABLET | Freq: Every evening | ORAL | 2 refills | Status: DC

## 2018-06-22 ENCOUNTER — Encounter: Payer: Self-pay | Admitting: Family Medicine

## 2018-06-22 ENCOUNTER — Ambulatory Visit: Payer: Managed Care, Other (non HMO) | Admitting: Family Medicine

## 2018-06-22 ENCOUNTER — Other Ambulatory Visit: Payer: Self-pay

## 2018-06-22 VITALS — Wt 211.0 lb

## 2018-06-22 DIAGNOSIS — R197 Diarrhea, unspecified: Secondary | ICD-10-CM | POA: Diagnosis not present

## 2018-06-22 DIAGNOSIS — R51 Headache: Secondary | ICD-10-CM | POA: Diagnosis not present

## 2018-06-22 DIAGNOSIS — R519 Headache, unspecified: Secondary | ICD-10-CM

## 2018-06-22 NOTE — Progress Notes (Signed)
   Subjective:   Documentation for virtual audio and video telecommunications through [employer] encounter:  The patient was located at home. 2 patient identifiers used.  The provider was located in the office. The patient did consent to this visit and is aware of possible charges through their insurance for this visit.  The other persons participating in this telemedicine service were none.    Patient ID: Randall Calderon, male    DOB: 1965-06-23, 53 y.o.   MRN: [id number]  HPI Chief Complaint  Patient presents with  . slight headache    headache and diarrhea since last night. dull ache, diarrhea- was up all night with it. no abdominal pain, no SOB.    Complains of dull headache and brown, watery loose diarrhea every 15-30 minutes yesterday evening and last night but this is improving.  States he has had 2 episodes of diarrhea this morning.  No abdominal pain, no blood or pus. Denies dizziness, chest pain, palpitations, shortness of breath, cough, nausea, vomiting.   States he has felt "warm to the touch and clammy" per his wife and has some back and upper leg pain. Fatigue also present.   States he took pepto bismol yesterday. It helped.  States he is drinking plenty of water.  No new foods or recent antibiotics.  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   Last colonoscopy in 2016 in [place] per patient.   State hemorrhoids are flared up again.   Reviewed allergies, medications, past medical, surgical, family, and social history.   Review of Systems Pertinent positives and negatives in the history of present illness.     Objective:   Physical Exam Wt 211 lb (95.7 kg)   BMI 31.16 kg/m   Alert and oriented and in no acute distress.  Respirations unlabored.  Denies chest pain, shortness of breath, abdominal pain or tenderness.  Normal speech, mood and thought process.      Assessment & Plan:  Diarrhea, unspecified  type  Acute nonintractable headache, unspecified headache type  Discussed limitations of a virtual visit. He is not in any acute distress and no red flag symptoms. He does not have access to a thermometer but states he does feel feverish.  Discussed that whether his symptoms are related to a viral illness versus foodborne illness that the treatment is currently the same.  Recommend hydration, Tylenol for headache, Imodium as long as he does not have fever or blood in the stool.  Diarrhea has improved slightly already. No known coronavirus exposure. Strict precautions that if he develops any worsening symptoms such as uncontrolled vomiting, severe abdominal pain, shortness of breath or high fever to let me know or go to the emergency department. Recommend that he stay home until symptoms resolve. Out of work note until he follows up with me virtually Wednesday.   Time spent on call was 16 minutes and in review of previous records 2 minutes total.  This virtual service is not related to other E/M service within previous 7 days.

## 2018-06-24 ENCOUNTER — Encounter: Payer: Self-pay | Admitting: Internal Medicine

## 2018-06-24 ENCOUNTER — Other Ambulatory Visit: Payer: Self-pay | Admitting: Family Medicine

## 2018-06-24 ENCOUNTER — Telehealth: Payer: Self-pay | Admitting: Family Medicine

## 2018-06-24 NOTE — Telephone Encounter (Signed)
I have written note for pt and will advise pt to have someone come pick it up for him.   Pt had symptoms and spoke to vickie and was advised to go get Covid testing done at [facility]

## 2018-06-24 NOTE — Telephone Encounter (Signed)
Pt called and states that he is going to the [facility] for testing at 11.30 today and states that it will be 5-7 days for the results, and he states that he will need a work note for that while he is out of work pt can be reached at [phone]

## 2018-06-24 NOTE — Telephone Encounter (Signed)
Ok to give him an out of work note due to medical illness until we get his Covid-19 results and his symptoms have resolved.

## 2018-07-01 ENCOUNTER — Encounter: Payer: Self-pay | Admitting: Family Medicine

## 2018-07-01 ENCOUNTER — Telehealth: Payer: Self-pay | Admitting: Family Medicine

## 2018-07-01 NOTE — Telephone Encounter (Signed)
Pt dropped off negative COVID results and needs letter releasing him back to work.  Pt is waiting in parking lot

## 2018-07-01 NOTE — Telephone Encounter (Signed)
Called pt back per [facility] and he has no symptoms, no fever for over 3 days.  Letter typed to release back to work ok per [employer]

## 2018-07-02 NOTE — Telephone Encounter (Signed)
done

## 2018-07-13 ENCOUNTER — Encounter: Payer: Self-pay | Admitting: Family Medicine

## 2018-08-11 ENCOUNTER — Other Ambulatory Visit: Payer: Self-pay | Admitting: Internal Medicine

## 2018-08-11 MED ORDER — METFORMIN HCL 500 MG PO TABS: 500.0000 mg | ORAL_TABLET | Freq: Two times a day (BID) | ORAL | 1 refills | Status: DC

## 2018-08-19 ENCOUNTER — Other Ambulatory Visit (INDEPENDENT_AMBULATORY_CARE_PROVIDER_SITE_OTHER): Payer: Self-pay | Admitting: Family Medicine

## 2018-08-19 ENCOUNTER — Encounter (INDEPENDENT_AMBULATORY_CARE_PROVIDER_SITE_OTHER): Payer: Self-pay | Admitting: Family Medicine

## 2018-09-29 ENCOUNTER — Other Ambulatory Visit: Payer: Self-pay | Admitting: Family Medicine

## 2018-11-01 NOTE — Progress Notes (Signed)
Subjective:    Patient ID: Randall Calderon, male    DOB: 09-07-1965, 53 y.o.   MRN: [id number]  Olaoluwa Grieder is a 53 y.o. male who presents for follow-up of Type 2 diabetes mellitus and other chronic health conditions  Patient is not checking home blood sugars.   Home blood sugar records: patient does not check sugars How often is blood sugars being checked: not Current symptoms include: none. Patient denies increased appetite, nausea, visual disturbances, vomiting and weight loss.  Patient is checking their feet daily. Any Foot concerns (callous, ulcer, wound, thickened nails, toenail fungus, skin fungus, hammer toe): none Last dilated eye exam: January 2020 this was done at [facility]  Current treatments: taking metformin 500 mg once daily. He was taking ER metformin but due to recall we switched him to metformin. Has not added the 2nd dose.  Medication compliance: good  Current diet: in general, a "healthy" diet   Current exercise: none Known diabetic complications: none   Elevated alk phos in past. Needs follow up. denies alcohol. He is taking diclofenac for knee pain.   HTN- he does not check BP at home. He brought in his wrist cuff and states it is not working. Taking lisinopril 10 mg daily.   The following portions of the patient's history were reviewed and updated as appropriate: allergies, current medications, past medical history, past social history and problem list.  ROS as in subjective above.     Objective:    Physical Exam Alert and in no distress otherwise not examined. Foot exam done and normal.   Blood pressure 140/90, pulse 71, temperature 97.7 F (36.5 C), weight 222 lb 3.2 oz (100.8 kg).  Lab Review Diabetic Labs Latest Ref Rng & Units 11/02/2018 04/27/2018 10/24/2017 04/23/2017 10/24/2016  HbA1c 4.0 - 5.6 % 6.7(A) 6.2(A) 6.6(A) 5.9% 6.5%  Microalbumin mg/dL - - - - 1.2  Micro/Creat Ratio <30 mcg/mg creat - - - - 4  Chol 100 - 199 mg/dL - 149 - 125  122  HDL >39 mg/dL - 47 - 46 47  Calc LDL 0 - 99 mg/dL - 89 - 69 61  Triglycerides 0 - 149 mg/dL - 66 - 51 59  Creatinine 0.76 - 1.27 mg/dL - 1.27 1.21 1.02 1.09   BP/Weight 11/02/2018 06/22/2018 04/27/2018 04/15/2018 10/14/4095  Systolic BP 353 - 299 242 683  Diastolic BP 90 - 70 90 80  Wt. (Lbs) 222.2 211 212.2 214.4 217.2  BMI 32.81 31.16 31.34 31.66 32.07   Foot/eye exam completion dates 11/02/2018 10/24/2016  Foot Form Completion Done Done    Carrie  reports that he has quit smoking. He quit after 5.00 years of use. He has never used smokeless tobacco. He reports that he does not drink alcohol or use drugs.     Assessment & Plan:    Controlled type 2 diabetes mellitus without complication, without long-term current use of insulin ([place]) - Plan: CBC with Differential/Platelet, Comprehensive metabolic panel, Microalbumin / creatinine urine ratio, Lipid panel, POCT glycosylated hemoglobin (Hb A1C)  Elevated alkaline phosphatase measurement - Plan: Comprehensive metabolic panel  Essential hypertension  Needs flu shot - Plan: Flu Vaccine QUAD 36+ mos IM  1. Rx changes: he will take metformin twice daily. he was previously taking ER metformin and this was changed due to recall Hgb A1c 6.7%  2. Education: Reviewed 'ABCs' of diabetes management (respective goals in parentheses):  A1C (<7), blood pressure (<130/80), and cholesterol (LDL <100). 3. Compliance at  present is estimated to be good. Efforts to improve compliance (if necessary) will be directed at dietary modifications: limit carbohydrates and sweets, increased exercise and he may start checking BS at home. a new meter and testing supplies provided today.   4. HTN- has been well controlled but up today. He will keep an eye on this outside of here. Continue lisinopril 10 mg for now.  5. Alk phos has been elevated. Will recheck and address as appropriate.  6. Request diabetic eye exam.  7. Foot exam done and urine microalbumin ordered.   8. Flu shot given.  9. Follow up: 6 months. CPE and diabetes visit is fine.

## 2018-11-02 ENCOUNTER — Encounter: Payer: Self-pay | Admitting: Family Medicine

## 2018-11-02 ENCOUNTER — Other Ambulatory Visit: Payer: Self-pay

## 2018-11-02 ENCOUNTER — Ambulatory Visit: Payer: Managed Care, Other (non HMO) | Admitting: Family Medicine

## 2018-11-02 VITALS — BP 140/90 | HR 71 | Temp 97.7°F | Wt 222.2 lb

## 2018-11-02 DIAGNOSIS — R748 Abnormal levels of other serum enzymes: Secondary | ICD-10-CM | POA: Diagnosis not present

## 2018-11-02 DIAGNOSIS — E119 Type 2 diabetes mellitus without complications: Secondary | ICD-10-CM

## 2018-11-02 DIAGNOSIS — I10 Essential (primary) hypertension: Secondary | ICD-10-CM | POA: Diagnosis not present

## 2018-11-02 DIAGNOSIS — Z23 Encounter for immunization: Secondary | ICD-10-CM | POA: Diagnosis not present

## 2018-11-02 LAB — POCT GLYCOSYLATED HEMOGLOBIN (HGB A1C): Hemoglobin A1C: 6.7 % — AB (ref 4.0–5.6)

## 2018-11-02 NOTE — Patient Instructions (Addendum)
Your hemoglobin A1c which is your average daily blood sugar over the past 3 months is 6.7% today.  This is up from 6.2% in March 2020. I recommend you pay close attention to your diet and limit carbohydrates such as potatoes, pasta, bread, rice and sweets.  Try to get at least 150 minutes of vigorous physical activity per week.  Increase Metformin 500 mg to twice daily since this is not the extended release you are on previously.  Your blood pressure today is 140/90 and this is elevated.  I recommend you check your blood pressure outside of here.  Goal readings are less than 130/80. If your blood pressures continue to stay elevated we can always increase your dose of lisinopril.  You can keep me posted  We will forward your lab results.

## 2018-11-03 LAB — MICROALBUMIN / CREATININE URINE RATIO
Creatinine, Urine: 266.9 mg/dL
Microalb/Creat Ratio: 4 mg/g creat (ref 0–29)
Microalbumin, Urine: 9.8 ug/mL

## 2018-11-03 LAB — COMPREHENSIVE METABOLIC PANEL
ALT: 33 IU/L (ref 0–44)
AST: 24 IU/L (ref 0–40)
Albumin/Globulin Ratio: 1.6 (ref 1.2–2.2)
Albumin: 4.5 g/dL (ref 3.8–4.9)
Alkaline Phosphatase: 159 IU/L — ABNORMAL HIGH (ref 39–117)
BUN/Creatinine Ratio: 7 — ABNORMAL LOW (ref 9–20)
BUN: 8 mg/dL (ref 6–24)
Bilirubin Total: 0.4 mg/dL (ref 0.0–1.2)
CO2: 24 mmol/L (ref 20–29)
Calcium: 9.2 mg/dL (ref 8.7–10.2)
Chloride: 103 mmol/L (ref 96–106)
Creatinine, Ser: 1.11 mg/dL (ref 0.76–1.27)
GFR calc Af Amer: 87 mL/min/{1.73_m2} (ref >59)
GFR calc non Af Amer: 75 mL/min/{1.73_m2} (ref >59)
Globulin, Total: 2.8 g/dL (ref 1.5–4.5)
Glucose: 117 mg/dL — ABNORMAL HIGH (ref 65–99)
Potassium: 4.4 mmol/L (ref 3.5–5.2)
Sodium: 140 mmol/L (ref 134–144)
Total Protein: 7.3 g/dL (ref 6.0–8.5)

## 2018-11-03 LAB — CBC WITH DIFFERENTIAL/PLATELET
Basophils Absolute: 0.1 10*3/uL (ref 0.0–0.2)
Basos: 1 %
EOS (ABSOLUTE): 0.3 10*3/uL (ref 0.0–0.4)
Eos: 5 %
Hematocrit: 40.3 % (ref 37.5–51.0)
Hemoglobin: 13 g/dL (ref 13.0–17.7)
Immature Grans (Abs): 0 10*3/uL (ref 0.0–0.1)
Immature Granulocytes: 0 %
Lymphocytes Absolute: 2.4 10*3/uL (ref 0.7–3.1)
Lymphs: 48 %
MCH: 29 pg (ref 26.6–33.0)
MCHC: 32.3 g/dL (ref 31.5–35.7)
MCV: 90 fL (ref 79–97)
Monocytes Absolute: 0.5 10*3/uL (ref 0.1–0.9)
Monocytes: 10 %
Neutrophils Absolute: 1.8 10*3/uL (ref 1.4–7.0)
Neutrophils: 36 %
Platelets: 210 10*3/uL (ref 150–450)
RBC: 4.49 x10E6/uL (ref 4.14–5.80)
RDW: 12.6 % (ref 11.6–15.4)
WBC: 5.1 10*3/uL (ref 3.4–10.8)

## 2018-11-03 LAB — LIPID PANEL
Chol/HDL Ratio: 3.3 ratio (ref 0.0–5.0)
Cholesterol, Total: 148 mg/dL (ref 100–199)
HDL: 45 mg/dL (ref >39)
LDL Chol Calc (NIH): 87 mg/dL (ref 0–99)
Triglycerides: 85 mg/dL (ref 0–149)
VLDL Cholesterol Cal: 16 mg/dL (ref 5–40)

## 2018-11-04 LAB — GAMMA GT: GGT: 44 IU/L (ref 0–65)

## 2018-11-04 LAB — SPECIMEN STATUS REPORT

## 2018-11-06 LAB — SPECIMEN STATUS REPORT

## 2018-11-06 LAB — ALKALINE PHOSPHATASE, ISOENZYMES
Alkaline Phosphatase: 157 IU/L — ABNORMAL HIGH (ref 39–117)
BONE FRACTION: 35 % (ref 12–68)
INTESTINAL FRAC.: 0 % (ref 0–18)
LIVER FRACTION: 65 % (ref 13–88)

## 2018-12-29 ENCOUNTER — Encounter: Payer: Self-pay | Admitting: Family Medicine

## 2018-12-29 MED ORDER — ATORVASTATIN CALCIUM 40 MG PO TABS: 40.0000 mg | ORAL_TABLET | Freq: Every day | ORAL | 1 refills | Status: DC

## 2018-12-29 MED ORDER — LISINOPRIL 10 MG PO TABS: 10.0000 mg | ORAL_TABLET | Freq: Every day | ORAL | 1 refills | Status: DC

## 2019-01-18 ENCOUNTER — Encounter: Payer: Self-pay | Admitting: Family Medicine

## 2019-01-18 ENCOUNTER — Other Ambulatory Visit: Payer: Self-pay

## 2019-01-18 ENCOUNTER — Ambulatory Visit (INDEPENDENT_AMBULATORY_CARE_PROVIDER_SITE_OTHER): Payer: Managed Care, Other (non HMO) | Admitting: Family Medicine

## 2019-01-18 VITALS — BP 120/80 | HR 62 | Temp 98.9°F | Wt 217.2 lb

## 2019-01-18 DIAGNOSIS — Z79899 Other long term (current) drug therapy: Secondary | ICD-10-CM

## 2019-01-18 DIAGNOSIS — E559 Vitamin D deficiency, unspecified: Secondary | ICD-10-CM

## 2019-01-18 DIAGNOSIS — M1711 Unilateral primary osteoarthritis, right knee: Secondary | ICD-10-CM | POA: Diagnosis not present

## 2019-01-18 DIAGNOSIS — R748 Abnormal levels of other serum enzymes: Secondary | ICD-10-CM | POA: Diagnosis not present

## 2019-01-18 MED ORDER — DICLOFENAC SODIUM 75 MG PO TBEC: 75.0000 mg | DELAYED_RELEASE_TABLET | Freq: Two times a day (BID) | ORAL | 0 refills | Status: DC | PRN

## 2019-01-18 NOTE — Patient Instructions (Signed)
Continue on your current medications including the multivitamin.   Also, use the Voltaren gel for your knee pain. You can use this daily. Try to avoid doing high impact exercises.   If you are needing the diclofenac for your knee pain most days of the week, follow up with Dr. Junius Roads or Dr. Erlinda Hong at [facility]

## 2019-01-18 NOTE — Progress Notes (Signed)
   Subjective:    Patient ID: Randall Calderon, male    DOB: 1965-08-02, 53 y.o.   MRN: [id number]  HPI Chief Complaint  Patient presents with  . Follow-up    follow-up on abnormal labs. recheck   He is here today to follow-up on elevated alkaline phosphatase and to check vitamin D level. He has been taking a multivitamin daily for vitamin D.  Does not drink alcohol.  Taking diclofenac every other day for right knee pain.  He has seen [facility] in the past for this issue.  He has had an MRI and an injection in the past in that knee.  He is also using topical Voltaren.  States he is still doing squats and high impact activities even though he knows it is bad for him. Denies any new injuries or new complaints with his knee  Diabetes controlled -states he is checking his BS at home. Readings have been between 117-124.   No other concerns today.  Denies fever, chills, night sweats, abdominal pain, nausea, vomiting, diarrhea.  Reviewed allergies, medications, past medical, surgical, family, and social history.   Review of Systems Pertinent positives and negatives in the history of present illness.     Objective:   Physical Exam BP 120/80   Pulse 62   Temp 98.9 F (37.2 C)   Wt 217 lb 3.2 oz (98.5 kg)   BMI 32.07 kg/m   Alert and oriented and in no acute distress.  Not otherwise examined.      Assessment & Plan:  Elevated alkaline phosphatase measurement - Plan: Comprehensive metabolic panel  Unilateral primary osteoarthritis, right knee - Plan: diclofenac (VOLTAREN) 75 MG EC tablet  Medication management - Plan: Vitamin D, 25-hydroxy  Vitamin D deficiency, unspecified - Plan: Vitamin D, 25-hydroxy  Discussed that alkaline phosphatase can be due to liver or bone mostly.  We have never checked his vitamin D but he is taking a multivitamin.  Does not know the dose of vitamin D.  Possible history of low vitamin D per patient We will follow-up pending lab results.   Discussed taking good care of his liver.  He is taking diclofenac most days of the week for knee pain.  Recommend that he follow-up with his orthopedist if he is still needing this on a regular basis.  I will give him a refill today.  Also recommend he use topical Voltaren and avoid high impact exercising

## 2019-01-19 LAB — COMPREHENSIVE METABOLIC PANEL
ALT: 38 IU/L (ref 0–44)
AST: 26 IU/L (ref 0–40)
Albumin/Globulin Ratio: 1.7 (ref 1.2–2.2)
Albumin: 4.5 g/dL (ref 3.8–4.9)
Alkaline Phosphatase: 171 IU/L — ABNORMAL HIGH (ref 39–117)
BUN/Creatinine Ratio: 9 (ref 9–20)
BUN: 10 mg/dL (ref 6–24)
Bilirubin Total: 0.2 mg/dL (ref 0.0–1.2)
CO2: 19 mmol/L — ABNORMAL LOW (ref 20–29)
Calcium: 9.7 mg/dL (ref 8.7–10.2)
Chloride: 103 mmol/L (ref 96–106)
Creatinine, Ser: 1.06 mg/dL (ref 0.76–1.27)
GFR calc Af Amer: 92 mL/min/{1.73_m2} (ref >59)
GFR calc non Af Amer: 79 mL/min/{1.73_m2} (ref >59)
Globulin, Total: 2.6 g/dL (ref 1.5–4.5)
Glucose: 125 mg/dL — ABNORMAL HIGH (ref 65–99)
Potassium: 4.2 mmol/L (ref 3.5–5.2)
Sodium: 139 mmol/L (ref 134–144)
Total Protein: 7.1 g/dL (ref 6.0–8.5)

## 2019-01-19 LAB — VITAMIN D 25 HYDROXY (VIT D DEFICIENCY, FRACTURES): Vit D, 25-Hydroxy: 30.4 ng/mL (ref 30.0–100.0)

## 2019-01-25 ENCOUNTER — Encounter: Payer: Self-pay | Admitting: Internal Medicine

## 2019-01-28 LAB — SPECIMEN STATUS REPORT

## 2019-01-28 LAB — GAMMA GT: GGT: 45 IU/L (ref 0–65)

## 2019-04-28 NOTE — Progress Notes (Signed)
Subjective:    Patient ID: Randall Calderon, male    DOB: Jun 21, 1965, 54 y.o.   MRN: [id number]  HPI Chief Complaint  Patient presents with  . fasting cpe    fasting cpe and diabetes   He is here for a complete physical exam and to follow up on other chronic health conditions.  Concerns he may have a hernia in the left inguinal area.  States he was straining and felt something like a pulling sensation. Reports pain and tenderness in his left testicle. 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 Denies any urinary symptoms.  No concern for STD.   Reports having 2 genital herpes outbreaks fairly close together. He took valacyclovir.   Still having issues with knee pain when running so he has to cut back sometimes and the pain improves.   Diabetes- Checks BS at home 1-2 x per week.  Metformin daily  Reports taking Lipitor daily   We are monitoring his alk phos. Has been elevated.    Social history: Lives with wife, son Randall Calderon who is 69, works for [employer]. Local driving. Gets DOT exams.  Denies smoking, drinking alcohol, drug use Diet: fairly healthy  Exercise: uses treadmill   Immunizations: Tdap, Prevnar 13, shingles UTD  Health maintenance:  Colonoscopy: 07/2014 at [facility]  Last PSA: unknown  Last Dental Exam: has upcoming appointment  Last Eye Exam: 2-3 month ago.   Wears seatbelt always, smoke detectors in home and functioning, does not text while driving, feels safe in home environment.  Reviewed allergies, medications, past medical, surgical, family, and social history.   Review of Systems Review of Systems Constitutional: -fever, -chills, -sweats, -unexpected weight change,-fatigue ENT: -runny nose, -ear pain, -sore throat Cardiology:  -chest pain, -palpitations, -edema Respiratory: -cough, -shortness of breath, -wheezing Gastroenterology: -abdominal pain, -nausea, -vomiting, -diarrhea, -constipation  Hematology:  -bleeding or bruising problems Musculoskeletal: -arthralgias, -myalgias, -joint swelling, -back pain Ophthalmology: -vision changes Urology: -dysuria, -difficulty urinating, -hematuria, -urinary frequency, -urgency Neurology: -headache, -weakness, -tingling, -numbness       Objective:   Physical Exam BP 124/64   Pulse 61   Temp 97.7 F (36.5 C)   Ht 5' 9.75" (1.772 m)   Wt 219 lb (99.3 kg)   BMI 31.65 kg/m   General Appearance:    Alert, cooperative, no distress, appears stated age  Head:    Normocephalic, without obvious abnormality, atraumatic  Eyes:    PERRL, conjunctiva/corneas clear, EOM's intact  Ears:    Normal TM's and external ear canals  Nose:   Mask in place   Throat:   Mask in place   Neck:   Supple, no lymphadenopathy;  thyroid:  no   enlargement/tenderness/nodules; no JVD  Back:    Spine nontender, no curvature, ROM normal, no CVA     tenderness  Lungs:     Clear to auscultation bilaterally without wheezes, rales or     ronchi; respirations unlabored  Chest Wall:    No tenderness or deformity   Heart:    Regular rate and rhythm, S1 and S2 normal, no murmur, rub   or gallop  Breast Exam:    No chest wall tenderness, masses or gynecomastia  Abdomen:     Soft, non-tender, nondistended, normoactive bowel sounds,    no masses, no hepatosplenomegaly  Genitalia:    Normal male external genitalia without lesions.  Testicles without masses the is significantly tender to palpation over the left  epididymis, no inguinal hernias.  Rectal:    Deferred   Extremities:   No clubbing, cyanosis or edema  Pulses:   2+ and symmetric all extremities  Skin:   Skin color, texture, turgor normal, no rashes or lesions  Lymph nodes:   Cervical, supraclavicular, and axillary nodes normal  Neurologic:   CNII-XII intact, normal strength, sensation and gait; reflexes 2+ and symmetric throughout          Psych:   Normal mood, affect, hygiene and grooming.         Assessment & Plan:    Routine general medical examination at a health care facility - Plan: CBC with Differential/Platelet, Comprehensive metabolic panel, TSH, POCT Urinalysis DIP (Proadvantage Device) -Here for a fasting CPE.  Preventive health care reviewed and he appears to be up-to-date.  Immunizations reviewed and he is up-to-date.  Counseling on healthy lifestyle including diet and exercise.  Discussed safety and health promotion.  Controlled type 2 diabetes mellitus without complication, without long-term current use of insulin ([place]) - Plan: HgB A1c, CBC with Differential/Platelet, Comprehensive metabolic panel, TSH, Lipid panel -Hemoglobin A1c has improved to 6.0%.  Recommend he continue with healthy diet and exercise as well as Metformin.  Essential hypertension -Blood pressure controlled.  Continue lisinopril.  Elevated alkaline phosphatase measurement - Plan: Comprehensive metabolic panel -Continue to monitor  HSV infection -Discussed that if he is having more frequent outbreaks that we could consider antiviral for daily prevention.  Screening for prostate cancer - Plan: PSA -Per screening guidelines  Pain in left testicle - Plan: POCT Urinalysis DIP (Proadvantage Device), doxycycline (VIBRA-TABS) 100 MG tablet -Most likely this is epididymitis and I will treat him with a course of doxycycline.  He will follow-up if worsening or if not back to baseline after completing antibiotics

## 2019-04-28 NOTE — Patient Instructions (Addendum)
Your diabetes is under good control.  Your hemoglobin A1c is 6.0%  Your blood pressure is under good control  Continue taking good care of yourself with a healthy diet and getting adequate exercise.  Please bring in or fax over your diabetic eye exam report.   Follow up in 6 months or sooner if needed.    Preventive Care 38-55 Years Old, Male Preventive care refers to lifestyle choices and visits with your health care provider that can promote health and wellness. This includes:  A yearly physical exam. This is also called an annual well check.  Regular dental and eye exams.  Immunizations.  Screening for certain conditions.  Healthy lifestyle choices, such as eating a healthy diet, getting regular exercise, not using drugs or products that contain nicotine and tobacco, and limiting alcohol use. What can I expect for my preventive care visit? Physical exam Your health care provider will check:  Height and weight. These may be used to calculate body mass index (BMI), which is a measurement that tells if you are at a healthy weight.  Heart rate and blood pressure.  Your skin for abnormal spots. Counseling Your health care provider may ask you questions about:  Alcohol, tobacco, and drug use.  Emotional well-being.  Home and relationship well-being.  Sexual activity.  Eating habits.  Work and work Statistician. What immunizations do I need?  Influenza (flu) vaccine  This is recommended every year. Tetanus, diphtheria, and pertussis (Tdap) vaccine  You may need a Td booster every 10 years. Varicella (chickenpox) vaccine  You may need this vaccine if you have not already been vaccinated. Zoster (shingles) vaccine  You may need this after age 26. Measles, mumps, and rubella (MMR) vaccine  You may need at least one dose of MMR if you were born in 1957 or later. You may also need a second dose. Pneumococcal conjugate (PCV13) vaccine  You may need this if you  have certain conditions and were not previously vaccinated. Pneumococcal polysaccharide (PPSV23) vaccine  You may need one or two doses if you smoke cigarettes or if you have certain conditions. Meningococcal conjugate (MenACWY) vaccine  You may need this if you have certain conditions. Hepatitis A vaccine  You may need this if you have certain conditions or if you travel or work in places where you may be exposed to hepatitis A. Hepatitis B vaccine  You may need this if you have certain conditions or if you travel or work in places where you may be exposed to hepatitis B. Haemophilus influenzae type b (Hib) vaccine  You may need this if you have certain risk factors. Human papillomavirus (HPV) vaccine  If recommended by your health care provider, you may need three doses over 6 months. You may receive vaccines as individual doses or as more than one vaccine together in one shot (combination vaccines). Talk with your health care provider about the risks and benefits of combination vaccines. What tests do I need? Blood tests  Lipid and cholesterol levels. These may be checked every 5 years, or more frequently if you are over 22 years old.  Hepatitis C test.  Hepatitis B test. Screening  Lung cancer screening. You may have this screening every year starting at age 60 if you have a 30-pack-year history of smoking and currently smoke or have quit within the past 15 years.  Prostate cancer screening. Recommendations will vary depending on your family history and other risks.  Colorectal cancer screening. All adults should have  this screening starting at age 29 and continuing until age 54. Your health care provider may recommend screening at age 61 if you are at increased risk. You will have tests every 1-10 years, depending on your results and the type of screening test.  Diabetes screening. This is done by checking your blood sugar (glucose) after you have not eaten for a while  (fasting). You may have this done every 1-3 years.  Sexually transmitted disease (STD) testing. Follow these instructions at home: Eating and drinking  Eat a diet that includes fresh fruits and vegetables, whole grains, lean protein, and low-fat dairy products.  Take vitamin and mineral supplements as recommended by your health care provider.  Do not drink alcohol if your health care provider tells you not to drink.  If you drink alcohol: ? Limit how much you have to 0-2 drinks a day. ? Be aware of how much alcohol is in your drink. In the [place] one drink equals one 12 oz bottle of beer (355 mL), one 5 oz glass of wine (148 mL), or one 1 oz glass of hard liquor (44 mL). Lifestyle  Take daily care of your teeth and gums.  Stay active. Exercise for at least 30 minutes on 5 or more days each week.  Do not use any products that contain nicotine or tobacco, such as cigarettes, e-cigarettes, and chewing tobacco. If you need help quitting, ask your health care provider.  If you are sexually active, practice safe sex. Use a condom or other form of protection to prevent STIs (sexually transmitted infections).  Talk with your health care provider about taking a low-dose aspirin every day starting at age 19. What's next?  Go to your health care provider once a year for a well check visit.  Ask your health care provider how often you should have your eyes and teeth checked.  Stay up to date on all vaccines. This information is not intended to replace advice given to you by your health care provider. Make sure you discuss any questions you have with your health care provider. Document Revised: 01/22/2018 Document Reviewed: 01/22/2018 Elsevier Patient Education  2020 [employer].

## 2019-04-29 ENCOUNTER — Other Ambulatory Visit: Payer: Self-pay

## 2019-04-29 ENCOUNTER — Ambulatory Visit: Payer: Managed Care, Other (non HMO) | Admitting: Family Medicine

## 2019-04-29 ENCOUNTER — Encounter: Payer: Self-pay | Admitting: Family Medicine

## 2019-04-29 VITALS — BP 124/64 | HR 61 | Temp 97.7°F | Ht 69.75 in | Wt 219.0 lb

## 2019-04-29 DIAGNOSIS — B009 Herpesviral infection, unspecified: Secondary | ICD-10-CM | POA: Diagnosis not present

## 2019-04-29 DIAGNOSIS — I10 Essential (primary) hypertension: Secondary | ICD-10-CM | POA: Diagnosis not present

## 2019-04-29 DIAGNOSIS — Z0000 Encounter for general adult medical examination without abnormal findings: Secondary | ICD-10-CM | POA: Diagnosis not present

## 2019-04-29 DIAGNOSIS — E119 Type 2 diabetes mellitus without complications: Secondary | ICD-10-CM | POA: Diagnosis not present

## 2019-04-29 DIAGNOSIS — N50812 Left testicular pain: Secondary | ICD-10-CM | POA: Diagnosis not present

## 2019-04-29 DIAGNOSIS — Z125 Encounter for screening for malignant neoplasm of prostate: Secondary | ICD-10-CM | POA: Diagnosis not present

## 2019-04-29 DIAGNOSIS — R748 Abnormal levels of other serum enzymes: Secondary | ICD-10-CM | POA: Diagnosis not present

## 2019-04-29 LAB — POCT URINALYSIS DIP (PROADVANTAGE DEVICE)
Bilirubin, UA: NEGATIVE
Blood, UA: NEGATIVE
Glucose, UA: NEGATIVE mg/dL
Ketones, POC UA: NEGATIVE mg/dL
Leukocytes, UA: NEGATIVE
Nitrite, UA: NEGATIVE
Protein Ur, POC: NEGATIVE mg/dL
Specific Gravity, Urine: 1.025
Urobilinogen, Ur: NEGATIVE
pH, UA: 6 (ref 5.0–8.0)

## 2019-04-29 LAB — POCT GLYCOSYLATED HEMOGLOBIN (HGB A1C): Hemoglobin A1C: 6 % — AB (ref 4.0–5.6)

## 2019-04-29 MED ORDER — DOXYCYCLINE HYCLATE 100 MG PO TABS: 100.0000 mg | ORAL_TABLET | Freq: Two times a day (BID) | ORAL | 0 refills | Status: DC

## 2019-04-30 LAB — CBC WITH DIFFERENTIAL/PLATELET
Basophils Absolute: 0 10*3/uL (ref 0.0–0.2)
Basos: 1 %
EOS (ABSOLUTE): 0.2 10*3/uL (ref 0.0–0.4)
Eos: 4 %
Hematocrit: 38.2 % (ref 37.5–51.0)
Hemoglobin: 12.6 g/dL — ABNORMAL LOW (ref 13.0–17.7)
Immature Grans (Abs): 0 10*3/uL (ref 0.0–0.1)
Immature Granulocytes: 0 %
Lymphocytes Absolute: 2.3 10*3/uL (ref 0.7–3.1)
Lymphs: 58 %
MCH: 28.6 pg (ref 26.6–33.0)
MCHC: 33 g/dL (ref 31.5–35.7)
MCV: 87 fL (ref 79–97)
Monocytes Absolute: 0.5 10*3/uL (ref 0.1–0.9)
Monocytes: 11 %
Neutrophils Absolute: 1 10*3/uL — ABNORMAL LOW (ref 1.4–7.0)
Neutrophils: 26 %
Platelets: 194 10*3/uL (ref 150–450)
RBC: 4.4 x10E6/uL (ref 4.14–5.80)
RDW: 12.9 % (ref 11.6–15.4)
WBC: 4 10*3/uL (ref 3.4–10.8)

## 2019-04-30 LAB — COMPREHENSIVE METABOLIC PANEL
ALT: 44 IU/L (ref 0–44)
AST: 28 IU/L (ref 0–40)
Albumin/Globulin Ratio: 1.8 (ref 1.2–2.2)
Albumin: 4.7 g/dL (ref 3.8–4.9)
Alkaline Phosphatase: 140 IU/L — ABNORMAL HIGH (ref 39–117)
BUN/Creatinine Ratio: 10 (ref 9–20)
BUN: 12 mg/dL (ref 6–24)
Bilirubin Total: 0.3 mg/dL (ref 0.0–1.2)
CO2: 22 mmol/L (ref 20–29)
Calcium: 9.3 mg/dL (ref 8.7–10.2)
Chloride: 102 mmol/L (ref 96–106)
Creatinine, Ser: 1.17 mg/dL (ref 0.76–1.27)
GFR calc Af Amer: 81 mL/min/{1.73_m2} (ref >59)
GFR calc non Af Amer: 70 mL/min/{1.73_m2} (ref >59)
Globulin, Total: 2.6 g/dL (ref 1.5–4.5)
Glucose: 108 mg/dL — ABNORMAL HIGH (ref 65–99)
Potassium: 4.3 mmol/L (ref 3.5–5.2)
Sodium: 138 mmol/L (ref 134–144)
Total Protein: 7.3 g/dL (ref 6.0–8.5)

## 2019-04-30 LAB — TSH: TSH: 1.75 u[IU]/mL (ref 0.450–4.500)

## 2019-04-30 LAB — LIPID PANEL
Chol/HDL Ratio: 2.8 ratio (ref 0.0–5.0)
Cholesterol, Total: 138 mg/dL (ref 100–199)
HDL: 50 mg/dL (ref >39)
LDL Chol Calc (NIH): 73 mg/dL (ref 0–99)
Triglycerides: 74 mg/dL (ref 0–149)
VLDL Cholesterol Cal: 15 mg/dL (ref 5–40)

## 2019-04-30 LAB — PSA: Prostate Specific Ag, Serum: 0.7 ng/mL (ref 0.0–4.0)

## 2019-05-03 ENCOUNTER — Encounter: Payer: Managed Care, Other (non HMO) | Admitting: Family Medicine

## 2019-05-04 LAB — IRON AND TIBC
Iron Saturation: 30 % (ref 15–55)
Iron: 85 ug/dL (ref 38–169)
Total Iron Binding Capacity: 286 ug/dL (ref 250–450)
UIBC: 201 ug/dL (ref 111–343)

## 2019-05-04 LAB — FERRITIN: Ferritin: 154 ng/mL (ref 30–400)

## 2019-05-04 LAB — SPECIMEN STATUS REPORT

## 2019-05-28 ENCOUNTER — Other Ambulatory Visit: Payer: Self-pay | Admitting: Family Medicine

## 2019-06-19 ENCOUNTER — Ambulatory Visit: Payer: Managed Care, Other (non HMO) | Attending: Internal Medicine

## 2019-06-19 DIAGNOSIS — Z23 Encounter for immunization: Secondary | ICD-10-CM

## 2019-06-19 NOTE — Progress Notes (Signed)
   Covid-19 Vaccination Clinic  Name:  Randall Calderon    MRN: [id number] DOB: November 22, 1965  06/19/2019  [name] was observed post Covid-19 immunization for 15 minutes without incident. He was provided with Vaccine Information Sheet and instruction to access the V-Safe system.   [name] was instructed to call 911 with any severe reactions post vaccine: Marland Kitchen Difficulty breathing  . Swelling of face and throat  . A fast heartbeat  . A bad rash all over body  . Dizziness and weakness   Immunizations Administered    Name Date Dose VIS Date Route   Pfizer COVID-19 Vaccine 06/19/2019  8:25 AM 0.3 mL 04/07/2018 Intramuscular   Manufacturer: [employer], [employer]   Lot: [id number]   NDC: [address]-8

## 2019-07-09 ENCOUNTER — Other Ambulatory Visit: Payer: Self-pay | Admitting: Family Medicine

## 2019-07-14 ENCOUNTER — Encounter: Payer: Self-pay | Admitting: Family Medicine

## 2019-07-14 ENCOUNTER — Other Ambulatory Visit: Payer: Self-pay | Admitting: Family Medicine

## 2019-07-14 MED ORDER — LISINOPRIL 10 MG PO TABS: 10.0000 mg | ORAL_TABLET | Freq: Every day | ORAL | 1 refills | Status: DC

## 2019-07-17 ENCOUNTER — Ambulatory Visit: Payer: Managed Care, Other (non HMO) | Attending: Internal Medicine

## 2019-07-17 DIAGNOSIS — Z23 Encounter for immunization: Secondary | ICD-10-CM

## 2019-07-17 NOTE — Progress Notes (Signed)
   Covid-19 Vaccination Clinic  Name:  Khristopher Kapaun    MRN: [id number] DOB: [birth year]  07/17/2019  Mr. Kallman was observed post Covid-19 immunization for 15 minutes without incident. He was provided with Vaccine Information Sheet and instruction to access the V-Safe system.   Mr. Forehand was instructed to call 911 with any severe reactions post vaccine: Marland Kitchen Difficulty breathing  . Swelling of face and throat  . A fast heartbeat  . A bad rash all over body  . Dizziness and weakness   Immunizations Administered    Name Date Dose VIS Date Route   Pfizer COVID-19 Vaccine 07/17/2019  8:09 AM 0.3 mL 04/07/2018 Intramuscular   Manufacturer: [employer], [employer]   Lot: [id number]   NDC: [address]-5

## 2019-09-13 ENCOUNTER — Encounter: Payer: Self-pay | Admitting: Family Medicine

## 2019-09-22 ENCOUNTER — Ambulatory Visit (INDEPENDENT_AMBULATORY_CARE_PROVIDER_SITE_OTHER): Payer: Managed Care, Other (non HMO)

## 2019-09-22 ENCOUNTER — Ambulatory Visit: Payer: Self-pay

## 2019-09-22 ENCOUNTER — Ambulatory Visit: Payer: Managed Care, Other (non HMO) | Admitting: Orthopaedic Surgery

## 2019-09-22 DIAGNOSIS — M25562 Pain in left knee: Secondary | ICD-10-CM

## 2019-09-22 DIAGNOSIS — M25561 Pain in right knee: Secondary | ICD-10-CM

## 2019-09-22 DIAGNOSIS — G8929 Other chronic pain: Secondary | ICD-10-CM

## 2019-09-22 MED ORDER — BUPIVACAINE HCL 0.5 % IJ SOLN
2.0000 mL | INTRAMUSCULAR | Status: AC | PRN
  Administered 2019-09-22: 2 mL via INTRA_ARTICULAR

## 2019-09-22 MED ORDER — LIDOCAINE HCL 1 % IJ SOLN
2.0000 mL | INTRAMUSCULAR | Status: AC | PRN
  Administered 2019-09-22: 2 mL

## 2019-09-22 MED ORDER — METHYLPREDNISOLONE ACETATE 40 MG/ML IJ SUSP
40.0000 mg | INTRAMUSCULAR | Status: AC | PRN
  Administered 2019-09-22: 40 mg via INTRA_ARTICULAR

## 2019-09-22 NOTE — Progress Notes (Signed)
Office Visit Note   Patient: Randall Calderon           Date of Birth: [birth year]           MRN: [id number] Visit Date: 09/22/2019              Requested by: Avanell Shackleton, NP-C [address] [place],  [place] [address] PCP: Avanell Shackleton, NP-C   Assessment & Plan: Visit Diagnoses:  1. Chronic pain of both knees     Plan: Bilateral knee cortisone injections performed today.  We also recommended Voltaren gel.  Pamphlet on Visco injection provided today.  Questions encouraged and answered.  Follow-up as needed.  Follow-Up Instructions: Return if symptoms worsen or fail to improve.   Orders:  Orders Placed This Encounter  Procedures  . XR KNEE 3 VIEW RIGHT  . XR KNEE 3 VIEW LEFT   No orders of the defined types were placed in this encounter.     Procedures: Large Joint Inj: bilateral knee on 09/22/2019 10:30 AM Indications: pain Details: 22 G needle  Arthrogram: No  Medications (Right): 2 mL lidocaine 1 %; 2 mL bupivacaine 0.5 %; 40 mg methylPREDNISolone acetate 40 MG/ML Medications (Left): 2 mL lidocaine 1 %; 2 mL bupivacaine 0.5 %; 40 mg methylPREDNISolone acetate 40 MG/ML Outcome: tolerated well, no immediate complications Patient was prepped and draped in the usual sterile fashion.       Clinical Data: No additional findings.   Subjective: Chief Complaint  Patient presents with  . Right Knee - Pain  . Left Knee - Pain    Randall Calderon is a very pleasant 54 year old gentleman comes in for follow-up of bilateral knee pain worse left greater than right.  We saw him about 2 years ago for the same issues.  He states that he is having burning and stiffness and start up stiffness and pain.  Takes ibuprofen which only helps temporarily.   Review of Systems  Constitutional: Negative.   All other systems reviewed and are negative.    Objective: Vital Signs: There were no vitals taken for this visit.  Physical Exam Vitals and nursing note reviewed.   Constitutional:      Appearance: He is well-developed.  Pulmonary:     Effort: Pulmonary effort is normal.  Abdominal:     Palpations: Abdomen is soft.  Skin:    General: Skin is warm.  Neurological:     Mental Status: He is alert and oriented to person, place, and time.  Psychiatric:        Behavior: Behavior normal.        Thought Content: Thought content normal.        Judgment: Judgment normal.     Ortho Exam Bilateral knees show no joint effusion.  1+ crepitus with range of motion.  No limitation range of motion.  Collaterals and cruciates are stable.  Mild pain with range of motion. Specialty Comments:  No specialty comments available.  Imaging: XR KNEE 3 VIEW LEFT  Result Date: 09/22/2019 Moderate degenerative joint disease.  Bipartite patella.  XR KNEE 3 VIEW RIGHT  Result Date: 09/22/2019 Moderate degenerative joint disease.    PMFS History: Patient Active Problem List   Diagnosis Date Noted  . Irritant contact dermatitis due to drug in contact with skin 04/15/2018  . 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 Anemia, unspecified 10/29/2017  . Elevated alkaline phosphatase measurement 10/29/2017  . Synovitis of knee 01/18/2017  . Derangement of medial meniscus, right 12/23/2016  .  Unilateral primary osteoarthritis, right knee 12/23/2016  . Controlled type 2 diabetes mellitus without complication, without long-term current use of insulin (HCC) 04/03/2016  . BPPV (benign paroxysmal positional vertigo) 04/03/2016  . History of chest pain 04/03/2016  . Chronic right shoulder pain 04/03/2016  . Essential hypertension 04/03/2016   Past Medical History:  Diagnosis Date  . Anemia, unspecified 10/29/2017  . DM type 2 (diabetes mellitus, type 2) (HCC)   . Elevated alkaline phosphatase measurement 10/29/2017  . History of hemorrhoids   . 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 HTN (hypertension)   . Obesity (BMI 30.0-34.9)     Family History  Problem Relation Age of Onset  . Stroke  Mother   . Cancer Mother   . Throat cancer Father   . Diabetes Sister   . Hypertension Brother     Past Surgical History:  Procedure Laterality Date  . KNEE SURGERY     18 years ago  . TONSILLECTOMY     Social History   Occupational History  . Not on file  Tobacco Use  . Smoking status: Former Smoker    Years: 5.00  . Smokeless tobacco: Never Used  Substance and Sexual Activity  . Alcohol use: No  . 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   Marland Kitchen Sexual activity: Not on file

## 2019-09-30 ENCOUNTER — Telehealth: Payer: Self-pay | Admitting: Orthopaedic Surgery

## 2019-09-30 NOTE — Telephone Encounter (Signed)
Patient's wife Randall Calderon called advised patient fell down some stairs at work on 09/24/2019 and x.rays were taken. 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 Patient went to [facility] urgent care for the injury. Davina asked if Dr Roda Shutters would look at the X-rays we took at [facility] and see if the Bipartite Split was already there before patient  fell on Friday. Randall Calderon said she could bring the disc from [facility] urgent  Care for Dr Roda Shutters to look at as well. The number to contact Randall Calderon is [phone]

## 2019-09-30 NOTE — Telephone Encounter (Signed)
Please make him an appt to see Dr Roda Shutters. Thanks.

## 2019-09-30 NOTE — Telephone Encounter (Signed)
Needs to come in to be seen

## 2019-10-08 ENCOUNTER — Encounter: Payer: Self-pay | Admitting: Physician Assistant

## 2019-10-08 ENCOUNTER — Other Ambulatory Visit: Payer: Self-pay

## 2019-10-08 ENCOUNTER — Telehealth: Payer: Self-pay | Admitting: Physician Assistant

## 2019-10-08 ENCOUNTER — Ambulatory Visit: Payer: Managed Care, Other (non HMO) | Admitting: Orthopaedic Surgery

## 2019-10-08 DIAGNOSIS — G8929 Other chronic pain: Secondary | ICD-10-CM

## 2019-10-08 DIAGNOSIS — M25562 Pain in left knee: Secondary | ICD-10-CM | POA: Diagnosis not present

## 2019-10-08 MED ORDER — TRAMADOL HCL 50 MG PO TABS: 50.0000 mg | ORAL_TABLET | Freq: Three times a day (TID) | ORAL | 0 refills | Status: DC | PRN

## 2019-10-08 NOTE — Telephone Encounter (Signed)
FYI  I called pharmacy and gave diagnosis. Patient can only get a 7 day supply filled since it is his first rx for this problem. They will fill #21 and have patient reach back out to [place] if refill is needed.

## 2019-10-08 NOTE — Telephone Encounter (Signed)
Ok thanks 

## 2019-10-08 NOTE — Telephone Encounter (Signed)
Randall Calderon with [facility] would like for Randall Calderon to call with the diagnosis for the tramadol  [phone]

## 2019-10-08 NOTE — Progress Notes (Signed)
Office Visit Note   Patient: Randall Calderon           Date of Birth: 04-Dec-1965           MRN: [id number] Visit Date: 10/08/2019              Requested by: Avanell Shackleton, NP-C [address] [place],  [place] [address] PCP: Avanell Shackleton, NP-C   Assessment & Plan: Visit Diagnoses:  1. Acute pain of left knee     Plan: Impression is acute left knee injury concerning for medial meniscus and possible ACL tears.  The patient will continue wearing his hinged knee brace.  He will ice and elevate for pain and swelling.  We will go ahead and order an MRI to assess for structural abnormalities.  He will continue with his current work restrictions which include light duty/desk work until follow-up appointment after the MRI.  Have called in tramadol to take as needed.  Call with concerns or questions in the meantime.  Follow-Up Instructions: Return for after MRI.   Orders:  No orders of the defined types were placed in this encounter.  Meds ordered this encounter  Medications  . traMADol (ULTRAM) 50 MG tablet    Sig: Take 1 tablet (50 mg total) by mouth 3 (three) times daily as needed.    Dispense:  30 tablet    Refill:  0      Procedures: No procedures performed   Clinical Data: No additional findings.   Subjective: Chief Complaint  Patient presents with  . Right Knee - Pain  . 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  This occurred on Friday, 09/24/2019.  This did occur while at work and is being filed under [employer]  He was going down a set of stairs when he sustained a mechanical fall.  His left knee went into a hyperflexed position where he heard a pop and then fell landing on his right side.  He was seen soon after at [facility] urgent care where x-rays were obtained.  X-rays demonstrated a bipartite patella which was unchanged compared to previous  x-rays from our office just 2 days prior.  Since this new injury, he has had pain primarily to the medial aspect of the knee.  Worse with putting any pressure on the left leg.  He has been taking over-the-counter pain medication as needed.  He has been wearing a hinged knee brace which does seem to provide some support.  Review of Systems as detailed in HPI.  All others reviewed and are negative.   Objective: Vital Signs: There were no vitals taken for this visit.  Physical Exam well-developed well-nourished gentleman no acute distress.  Alert and oriented x3.  Ortho Exam examination of the left knee shows a trace effusion.  Range of motion from 0 to 100 degrees.  He has marked tenderness along the medial joint line and mild tenderness along the MCL.  He is stable to valgus and varus stress in both without pain.  He does have slight increased laxity with anterior drawer compared to the right knee.  He is neurovascular intact distally.  Specialty Comments:  No specialty comments available.  Imaging: No new imaging   PMFS History: Patient Active Problem List   Diagnosis Date Noted  . Irritant contact dermatitis due to drug in contact with skin 04/15/2018  .  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 Anemia, unspecified 10/29/2017  . Elevated alkaline phosphatase measurement 10/29/2017  . Synovitis of knee 01/18/2017  . Derangement of medial meniscus, right 12/23/2016  . Unilateral primary osteoarthritis, right knee 12/23/2016  . Controlled type 2 diabetes mellitus without complication, without long-term current use of insulin (HCC) 04/03/2016  . BPPV (benign paroxysmal positional vertigo) 04/03/2016  . History of chest pain 04/03/2016  . Chronic right shoulder pain 04/03/2016  . Essential hypertension 04/03/2016   Past Medical History:  Diagnosis Date  . Anemia, unspecified 10/29/2017  . DM type 2 (diabetes mellitus, type 2) (HCC)   . Elevated alkaline phosphatase measurement 10/29/2017  .  History of hemorrhoids   . 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 HTN (hypertension)   . Obesity (BMI 30.0-34.9)     Family History  Problem Relation Age of Onset  . Stroke Mother   . Cancer Mother   . Throat cancer Father   . Diabetes Sister   . Hypertension Brother     Past Surgical History:  Procedure Laterality Date  . KNEE SURGERY     18 years ago  . TONSILLECTOMY     Social History   Occupational History  . Not on file  Tobacco Use  . Smoking status: Former Smoker    Years: 5.00  . Smokeless tobacco: Never Used  Substance and Sexual Activity  . Alcohol use: No  . 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   Marland Kitchen Sexual activity: Not on file

## 2019-10-13 ENCOUNTER — Ambulatory Visit: Payer: Managed Care, Other (non HMO) | Admitting: Orthopaedic Surgery

## 2019-10-14 ENCOUNTER — Other Ambulatory Visit: Payer: Self-pay | Admitting: Family Medicine

## 2019-10-29 ENCOUNTER — Other Ambulatory Visit: Payer: Self-pay

## 2019-10-29 ENCOUNTER — Ambulatory Visit
Admission: RE | Admit: 2019-10-29 | Discharge: 2019-10-29 | Disposition: A | Discharge status: Home / Self Care | Payer: Worker's Compensation | Source: Ambulatory Visit | Attending: Physician Assistant | Admitting: Physician Assistant

## 2019-10-29 DIAGNOSIS — M25562 Pain in left knee: Secondary | ICD-10-CM

## 2019-10-29 DIAGNOSIS — G8929 Other chronic pain: Secondary | ICD-10-CM

## 2019-11-01 NOTE — Progress Notes (Signed)
F/u to discuss

## 2019-11-02 ENCOUNTER — Other Ambulatory Visit: Payer: Managed Care, Other (non HMO)

## 2019-11-02 ENCOUNTER — Ambulatory Visit (INDEPENDENT_AMBULATORY_CARE_PROVIDER_SITE_OTHER): Payer: Worker's Compensation | Admitting: Orthopaedic Surgery

## 2019-11-02 DIAGNOSIS — S83242A Other tear of medial meniscus, current injury, left knee, initial encounter: Secondary | ICD-10-CM | POA: Diagnosis not present

## 2019-11-02 MED ORDER — CYCLOBENZAPRINE HCL 5 MG PO TABS: 5.0000 mg | ORAL_TABLET | Freq: Three times a day (TID) | ORAL | 3 refills | Status: DC | PRN

## 2019-11-02 MED ORDER — IBUPROFEN 800 MG PO TABS: 800.0000 mg | ORAL_TABLET | Freq: Three times a day (TID) | ORAL | 2 refills | Status: DC | PRN

## 2019-11-02 NOTE — Progress Notes (Signed)
Office Visit Note   Patient: Randall Calderon           Date of Birth: 10/14/1965           MRN: [id number] Visit Date: 11/02/2019              Requested by: Randall Shackleton, NP-C [address] [place],  [place] [address] PCP: Randall Shackleton, NP-C   Assessment & Plan: Visit Diagnoses:  1. Acute medial meniscal tear, left, initial encounter     Plan: MRI shows a near complete radial tear of the posterior horn of the medial meniscal root with slight extrusion.  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  Based on these findings I have recommended medial meniscal root repair versus debridement and chondroplasty.  We talked about the risk benefits rehab recovery the surgery.  We also talked about nonsurgical treatment and the potential for rapid progression of degenerative joint disease with the meniscal root tear.  After consideration of his options he would like to move forward with surgical treatment.  We will obtain approval from Randall Calderon. and schedule surgery for the near future. Total face to face encounter time was greater than 25 minutes and over half of this time was spent in counseling and/or coordination of care.  Follow-Up Instructions: Return if symptoms worsen or fail to improve.   Orders:  No orders of the defined types were placed in this encounter.  Meds ordered this encounter  Medications  . ibuprofen (ADVIL) 800 MG tablet    Sig: Take 1 tablet (800 mg total) by mouth every 8 (eight) hours as needed.    Dispense:  60 tablet    Refill:  2  . cyclobenzaprine (FLEXERIL) 5 MG tablet    Sig: Take 1-2 tablets (5-10 mg total) by mouth 3 (three) times daily as needed for muscle spasms.    Dispense:  30 tablet    Refill:  3      Procedures: No procedures performed   Clinical Data: No additional findings.   Subjective: Chief Complaint  Patient presents with  . scan review     Randall Calderon returns today for MRI review.  No changes in medical history.  Left knee continues to be painful with ambulation and weightbearing.  Currently on no work restrictions.   Review of Systems   Objective: Vital Signs: There were no vitals taken for this visit.  Physical Exam  Ortho Exam Left knee exam is unchanged. Specialty Comments:  No specialty comments available.  Imaging: No results found.   PMFS History: Patient Active Problem List   Diagnosis Date Noted  . Acute medial meniscal tear, left, initial encounter 11/02/2019  . Irritant contact dermatitis due to drug in contact with skin 04/15/2018  . 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 Anemia, unspecified 10/29/2017  . Elevated alkaline phosphatase measurement 10/29/2017  . Synovitis of knee 01/18/2017  . Derangement of medial meniscus, right 12/23/2016  . Unilateral primary osteoarthritis, right knee 12/23/2016  . Controlled type 2 diabetes mellitus without complication, without long-term current use of insulin (HCC) 04/03/2016  . BPPV (benign paroxysmal positional vertigo) 04/03/2016  . History of chest pain 04/03/2016  . Chronic right shoulder pain 04/03/2016  . Essential hypertension 04/03/2016   Past Medical History:  Diagnosis Date  . Anemia, unspecified 10/29/2017  . DM type 2 (diabetes mellitus, type 2) (HCC)   . Elevated alkaline phosphatase measurement 10/29/2017  .  History of hemorrhoids   . 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 HTN (hypertension)   . Obesity (BMI 30.0-34.9)     Family History  Problem Relation Age of Onset  . Stroke Mother   . Cancer Mother   . Throat cancer Father   . Diabetes Sister   . Hypertension Brother     Past Surgical History:  Procedure Laterality Date  . KNEE SURGERY     18 years ago  . TONSILLECTOMY     Social History   Occupational History  . Not on file  Tobacco Use  . Smoking status: Former Smoker    Years: 5.00  . Smokeless tobacco: Never Used  Substance and  Sexual Activity  . Alcohol use: No  . 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   Marland Kitchen Sexual activity: Not on file

## 2019-11-03 ENCOUNTER — Encounter: Payer: Managed Care, Other (non HMO) | Admitting: Family Medicine

## 2019-11-22 ENCOUNTER — Encounter: Payer: Self-pay | Admitting: Orthopaedic Surgery

## 2019-11-22 ENCOUNTER — Other Ambulatory Visit: Payer: Self-pay

## 2019-11-22 ENCOUNTER — Encounter (HOSPITAL_BASED_OUTPATIENT_CLINIC_OR_DEPARTMENT_OTHER): Payer: Self-pay | Admitting: Orthopaedic Surgery

## 2019-11-23 ENCOUNTER — Encounter (HOSPITAL_BASED_OUTPATIENT_CLINIC_OR_DEPARTMENT_OTHER)
Admission: RE | Admit: 2019-11-23 | Discharge: 2019-11-23 | Disposition: A | Discharge status: Home / Self Care | Payer: Managed Care, Other (non HMO) | Source: Ambulatory Visit | Attending: Orthopaedic Surgery | Admitting: Orthopaedic Surgery

## 2019-11-23 ENCOUNTER — Other Ambulatory Visit (HOSPITAL_COMMUNITY)
Admission: RE | Admit: 2019-11-23 | Discharge: 2019-11-23 | Disposition: A | Discharge status: Home / Self Care | Payer: Managed Care, Other (non HMO) | Source: Ambulatory Visit | Attending: Orthopaedic Surgery | Admitting: Orthopaedic Surgery

## 2019-11-23 DIAGNOSIS — Z01812 Encounter for preprocedural laboratory examination: Secondary | ICD-10-CM | POA: Insufficient documentation

## 2019-11-23 DIAGNOSIS — Z20822 Contact with and (suspected) exposure to covid-19: Secondary | ICD-10-CM | POA: Insufficient documentation

## 2019-11-23 DIAGNOSIS — Z01818 Encounter for other preprocedural examination: Secondary | ICD-10-CM | POA: Diagnosis not present

## 2019-11-23 LAB — BASIC METABOLIC PANEL
Anion gap: 7 (ref 5–15)
BUN: 9 mg/dL (ref 6–20)
CO2: 28 mmol/L (ref 22–32)
Calcium: 9.4 mg/dL (ref 8.9–10.3)
Chloride: 104 mmol/L (ref 98–111)
Creatinine, Ser: 1.22 mg/dL (ref 0.61–1.24)
GFR, Estimated: >60 mL/min (ref >60)
Glucose, Bld: 129 mg/dL — ABNORMAL HIGH (ref 70–99)
Potassium: 4.4 mmol/L (ref 3.5–5.1)
Sodium: 139 mmol/L (ref 135–145)

## 2019-11-23 LAB — SARS CORONAVIRUS 2 (TAT 6-24 HRS): SARS Coronavirus 2: NEGATIVE

## 2019-11-23 NOTE — Progress Notes (Signed)

## 2019-11-26 ENCOUNTER — Encounter (HOSPITAL_BASED_OUTPATIENT_CLINIC_OR_DEPARTMENT_OTHER)
Admission: RE | Disposition: A | Discharge status: Home / Self Care | Payer: Self-pay | Source: Home / Self Care | Attending: Orthopaedic Surgery

## 2019-11-26 ENCOUNTER — Ambulatory Visit (HOSPITAL_BASED_OUTPATIENT_CLINIC_OR_DEPARTMENT_OTHER)
Admission: RE | Admit: 2019-11-26 | Discharge: 2019-11-26 | Disposition: A | Discharge status: Home / Self Care | Payer: No Typology Code available for payment source | Attending: Orthopaedic Surgery | Admitting: Orthopaedic Surgery

## 2019-11-26 ENCOUNTER — Other Ambulatory Visit: Payer: Self-pay | Admitting: Physician Assistant

## 2019-11-26 ENCOUNTER — Other Ambulatory Visit: Payer: Self-pay

## 2019-11-26 ENCOUNTER — Ambulatory Visit (HOSPITAL_BASED_OUTPATIENT_CLINIC_OR_DEPARTMENT_OTHER): Payer: No Typology Code available for payment source | Admitting: Anesthesiology

## 2019-11-26 ENCOUNTER — Encounter (HOSPITAL_BASED_OUTPATIENT_CLINIC_OR_DEPARTMENT_OTHER): Payer: Self-pay | Admitting: Orthopaedic Surgery

## 2019-11-26 ENCOUNTER — Encounter: Payer: Self-pay | Admitting: Orthopaedic Surgery

## 2019-11-26 ENCOUNTER — Other Ambulatory Visit: Payer: Self-pay | Admitting: Radiology

## 2019-11-26 DIAGNOSIS — Z7984 Long term (current) use of oral hypoglycemic drugs: Secondary | ICD-10-CM | POA: Insufficient documentation

## 2019-11-26 DIAGNOSIS — E119 Type 2 diabetes mellitus without complications: Secondary | ICD-10-CM | POA: Insufficient documentation

## 2019-11-26 DIAGNOSIS — M94262 Chondromalacia, left knee: Secondary | ICD-10-CM | POA: Diagnosis not present

## 2019-11-26 DIAGNOSIS — Z79899 Other long term (current) drug therapy: Secondary | ICD-10-CM | POA: Insufficient documentation

## 2019-11-26 DIAGNOSIS — Z6832 Body mass index (BMI) 32.0-32.9, adult: Secondary | ICD-10-CM | POA: Insufficient documentation

## 2019-11-26 DIAGNOSIS — I491 Atrial premature depolarization: Secondary | ICD-10-CM | POA: Insufficient documentation

## 2019-11-26 DIAGNOSIS — Z791 Long term (current) use of non-steroidal anti-inflammatories (NSAID): Secondary | ICD-10-CM | POA: Insufficient documentation

## 2019-11-26 DIAGNOSIS — Z833 Family history of diabetes mellitus: Secondary | ICD-10-CM | POA: Insufficient documentation

## 2019-11-26 DIAGNOSIS — X58XXXA Exposure to other specified factors, initial encounter: Secondary | ICD-10-CM | POA: Insufficient documentation

## 2019-11-26 DIAGNOSIS — Z823 Family history of stroke: Secondary | ICD-10-CM | POA: Insufficient documentation

## 2019-11-26 DIAGNOSIS — Z800 Family history of malignant neoplasm of digestive organs: Secondary | ICD-10-CM | POA: Diagnosis not present

## 2019-11-26 DIAGNOSIS — Z8249 Family history of ischemic heart disease and other diseases of the circulatory system: Secondary | ICD-10-CM | POA: Diagnosis not present

## 2019-11-26 DIAGNOSIS — M659 Synovitis and tenosynovitis, unspecified: Secondary | ICD-10-CM | POA: Diagnosis not present

## 2019-11-26 DIAGNOSIS — D649 Anemia, unspecified: Secondary | ICD-10-CM | POA: Diagnosis not present

## 2019-11-26 DIAGNOSIS — I10 Essential (primary) hypertension: Secondary | ICD-10-CM | POA: Insufficient documentation

## 2019-11-26 DIAGNOSIS — Z809 Family history of malignant neoplasm, unspecified: Secondary | ICD-10-CM | POA: Diagnosis not present

## 2019-11-26 DIAGNOSIS — E669 Obesity, unspecified: Secondary | ICD-10-CM | POA: Diagnosis not present

## 2019-11-26 DIAGNOSIS — Y990 Civilian activity done for income or pay: Secondary | ICD-10-CM | POA: Diagnosis not present

## 2019-11-26 DIAGNOSIS — M65862 Other synovitis and tenosynovitis, left lower leg: Secondary | ICD-10-CM

## 2019-11-26 DIAGNOSIS — S83242A Other tear of medial meniscus, current injury, left knee, initial encounter: Secondary | ICD-10-CM | POA: Diagnosis present

## 2019-11-26 DIAGNOSIS — Y939 Activity, unspecified: Secondary | ICD-10-CM | POA: Diagnosis not present

## 2019-11-26 DIAGNOSIS — Z87891 Personal history of nicotine dependence: Secondary | ICD-10-CM | POA: Insufficient documentation

## 2019-11-26 HISTORY — PX: KNEE ARTHROSCOPY WITH MENISCAL REPAIR: SHX5653

## 2019-11-26 LAB — GLUCOSE, CAPILLARY
Glucose-Capillary: 123 mg/dL — ABNORMAL HIGH (ref 70–99)
Glucose-Capillary: 167 mg/dL — ABNORMAL HIGH (ref 70–99)

## 2019-11-26 SURGERY — ARTHROSCOPY, KNEE, WITH MENISCUS REPAIR
Anesthesia: General | Site: Knee | Laterality: Left

## 2019-11-26 MED ORDER — HYDROMORPHONE HCL 1 MG/ML IJ SOLN
INTRAMUSCULAR | Status: AC
  Filled 2019-11-26: qty 0.5

## 2019-11-26 MED ORDER — CEFAZOLIN SODIUM-DEXTROSE 2-4 GM/100ML-% IV SOLN
2.0000 g | INTRAVENOUS | Status: AC
  Administered 2019-11-26: 2 g via INTRAVENOUS

## 2019-11-26 MED ORDER — ACETAMINOPHEN 10 MG/ML IV SOLN
1000.0000 mg | Freq: Once | INTRAVENOUS | Status: AC
  Administered 2019-11-26: 1000 mg via INTRAVENOUS

## 2019-11-26 MED ORDER — ONDANSETRON HCL 4 MG/2ML IJ SOLN
INTRAMUSCULAR | Status: AC
  Filled 2019-11-26: qty 2

## 2019-11-26 MED ORDER — OXYCODONE HCL 5 MG PO TABS
ORAL_TABLET | ORAL | Status: AC
  Filled 2019-11-26: qty 1

## 2019-11-26 MED ORDER — EPHEDRINE 5 MG/ML INJ
INTRAVENOUS | Status: AC
  Filled 2019-11-26: qty 10

## 2019-11-26 MED ORDER — OXYCODONE-ACETAMINOPHEN 5-325 MG PO TABS
1.0000 | ORAL_TABLET | Freq: Three times a day (TID) | ORAL | 0 refills | Status: DC | PRN
Start: 2019-11-26 — End: 2019-11-26

## 2019-11-26 MED ORDER — LACTATED RINGERS IV SOLN: INTRAVENOUS | Status: DC

## 2019-11-26 MED ORDER — ONDANSETRON HCL 4 MG/2ML IJ SOLN: 4.0000 mg | Freq: Once | INTRAMUSCULAR | Status: DC | PRN

## 2019-11-26 MED ORDER — PROPOFOL 10 MG/ML IV BOLUS
INTRAVENOUS | Status: AC
  Filled 2019-11-26: qty 20

## 2019-11-26 MED ORDER — FENTANYL CITRATE (PF) 100 MCG/2ML IJ SOLN
INTRAMUSCULAR | Status: AC
  Filled 2019-11-26: qty 2

## 2019-11-26 MED ORDER — ACETAMINOPHEN 10 MG/ML IV SOLN
INTRAVENOUS | Status: AC
  Filled 2019-11-26: qty 100

## 2019-11-26 MED ORDER — SODIUM CHLORIDE 0.9 % IR SOLN
Status: DC | PRN
  Administered 2019-11-26: 6000 mL

## 2019-11-26 MED ORDER — ACETAMINOPHEN 160 MG/5ML PO SOLN: 325.0000 mg | ORAL | Status: DC | PRN

## 2019-11-26 MED ORDER — EPHEDRINE SULFATE 50 MG/ML IJ SOLN
INTRAMUSCULAR | Status: DC | PRN
  Administered 2019-11-26: 10 mg via INTRAVENOUS

## 2019-11-26 MED ORDER — BUPIVACAINE HCL (PF) 0.25 % IJ SOLN
INTRAMUSCULAR | Status: AC
  Filled 2019-11-26: qty 30

## 2019-11-26 MED ORDER — FENTANYL CITRATE (PF) 100 MCG/2ML IJ SOLN
INTRAMUSCULAR | Status: DC | PRN
  Administered 2019-11-26: 100 ug via INTRAVENOUS

## 2019-11-26 MED ORDER — ONDANSETRON HCL 4 MG/2ML IJ SOLN
INTRAMUSCULAR | Status: DC | PRN
  Administered 2019-11-26: 4 mg via INTRAVENOUS

## 2019-11-26 MED ORDER — HYDROMORPHONE HCL 1 MG/ML IJ SOLN
0.2500 mg | INTRAMUSCULAR | Status: DC | PRN
  Administered 2019-11-26 (×2): 0.25 mg via INTRAVENOUS

## 2019-11-26 MED ORDER — MIDAZOLAM HCL 5 MG/5ML IJ SOLN
INTRAMUSCULAR | Status: DC | PRN
  Administered 2019-11-26: 2 mg via INTRAVENOUS

## 2019-11-26 MED ORDER — LIDOCAINE HCL (CARDIAC) PF 100 MG/5ML IV SOSY
PREFILLED_SYRINGE | INTRAVENOUS | Status: DC | PRN
  Administered 2019-11-26: 80 mg via INTRATRACHEAL

## 2019-11-26 MED ORDER — OXYCODONE HCL 5 MG PO TABS
5.0000 mg | ORAL_TABLET | Freq: Once | ORAL | Status: AC
  Administered 2019-11-26: 5 mg via ORAL

## 2019-11-26 MED ORDER — ACETAMINOPHEN 325 MG PO TABS: 325.0000 mg | ORAL_TABLET | ORAL | Status: DC | PRN

## 2019-11-26 MED ORDER — OXYCODONE-ACETAMINOPHEN 5-325 MG PO TABS: 1.0000 | ORAL_TABLET | Freq: Three times a day (TID) | ORAL | 0 refills | Status: DC | PRN

## 2019-11-26 MED ORDER — DEXAMETHASONE SODIUM PHOSPHATE 10 MG/ML IJ SOLN
INTRAMUSCULAR | Status: DC | PRN
  Administered 2019-11-26: 8 mg via INTRAVENOUS

## 2019-11-26 MED ORDER — CEFAZOLIN SODIUM-DEXTROSE 2-4 GM/100ML-% IV SOLN
INTRAVENOUS | Status: AC
  Filled 2019-11-26: qty 100

## 2019-11-26 MED ORDER — PROPOFOL 10 MG/ML IV BOLUS
INTRAVENOUS | Status: DC | PRN
  Administered 2019-11-26: 200 mg via INTRAVENOUS

## 2019-11-26 MED ORDER — MIDAZOLAM HCL 2 MG/2ML IJ SOLN
INTRAMUSCULAR | Status: AC
  Filled 2019-11-26: qty 2

## 2019-11-26 MED ORDER — LIDOCAINE 2% (20 MG/ML) 5 ML SYRINGE
INTRAMUSCULAR | Status: AC
  Filled 2019-11-26: qty 5

## 2019-11-26 MED ORDER — BUPIVACAINE HCL (PF) 0.25 % IJ SOLN
INTRAMUSCULAR | Status: DC | PRN
  Administered 2019-11-26: 20 mL

## 2019-11-26 SURGICAL SUPPLY — 38 items
BANDAGE ESMARK 6X9 LF (GAUZE/BANDAGES/DRESSINGS) IMPLANT
BLADE EXCALIBUR 4.0X13 (MISCELLANEOUS) ×2 IMPLANT
BLADE SHAVER TORPEDO 4X13 (MISCELLANEOUS) IMPLANT
BNDG ELASTIC 6X5.8 VLCR STR LF (GAUZE/BANDAGES/DRESSINGS) ×4 IMPLANT
BNDG ESMARK 6X9 LF (GAUZE/BANDAGES/DRESSINGS)
BURR OVAL 8 FLU 4.0X13 (MISCELLANEOUS) ×2 IMPLANT
COVER WAND RF STERILE (DRAPES) IMPLANT
CUFF TOURN SGL QUICK 34 (TOURNIQUET CUFF) ×1
CUFF TRNQT CYL 34X4.125X (TOURNIQUET CUFF) ×1 IMPLANT
DRAPE ARTHROSCOPY W/POUCH 90 (DRAPES) ×2 IMPLANT
DRAPE IMP U-DRAPE 54X76 (DRAPES) ×2 IMPLANT
DRAPE U-SHAPE 47X51 STRL (DRAPES) ×2 IMPLANT
DURAPREP 26ML APPLICATOR (WOUND CARE) ×2 IMPLANT
GAUZE SPONGE 4X4 12PLY STRL (GAUZE/BANDAGES/DRESSINGS) ×2 IMPLANT
GAUZE XEROFORM 1X8 LF (GAUZE/BANDAGES/DRESSINGS) ×2 IMPLANT
GLOVE BIO SURGEON STRL SZ 6.5 (GLOVE) ×4 IMPLANT
GLOVE BIOGEL PI IND STRL 7.0 (GLOVE) ×3 IMPLANT
GLOVE BIOGEL PI INDICATOR 7.0 (GLOVE) ×3
GLOVE ECLIPSE 7.0 STRL STRAW (GLOVE) ×2 IMPLANT
GLOVE SKINSENSE NS SZ7.5 (GLOVE) ×1
GLOVE SKINSENSE STRL SZ7.5 (GLOVE) ×1 IMPLANT
GLOVE SURG SYN 7.5  E (GLOVE) ×1
GLOVE SURG SYN 7.5 E (GLOVE) ×1 IMPLANT
GOWN STRL REIN XL XLG (GOWN DISPOSABLE) ×2 IMPLANT
GOWN STRL REUS W/ TWL LRG LVL3 (GOWN DISPOSABLE) ×1 IMPLANT
GOWN STRL REUS W/ TWL XL LVL3 (GOWN DISPOSABLE) ×1 IMPLANT
GOWN STRL REUS W/TWL LRG LVL3 (GOWN DISPOSABLE) ×1
GOWN STRL REUS W/TWL XL LVL3 (GOWN DISPOSABLE) ×1
KIT ROOT REPAIR MEINISCAL PEEK (Anchor) ×1 IMPLANT
MANIFOLD NEPTUNE II (INSTRUMENTS) ×2 IMPLANT
MEINISCAL ROOT REPAIR KIT PEEK (Anchor) ×2 IMPLANT
PACK ARTHROSCOPY DSU (CUSTOM PROCEDURE TRAY) ×2 IMPLANT
PACK BASIN DAY SURGERY FS (CUSTOM PROCEDURE TRAY) ×2 IMPLANT
SHEET MEDIUM DRAPE 40X70 STRL (DRAPES) ×2 IMPLANT
SUT ETHILON 3 0 PS 1 (SUTURE) ×2 IMPLANT
TOWEL GREEN STERILE FF (TOWEL DISPOSABLE) ×2 IMPLANT
TUBING ARTHROSCOPY IRRIG 16FT (MISCELLANEOUS) ×2 IMPLANT
WRAP KNEE MAXI GEL POST OP (GAUZE/BANDAGES/DRESSINGS) ×2 IMPLANT

## 2019-11-26 NOTE — Telephone Encounter (Signed)
Printed and placed at front desk for patient to pick up. Dr. Roda Shutters advised pt.

## 2019-11-26 NOTE — H&P (Signed)
PREOPERATIVE H&P  Chief Complaint: left knee medial meniscal tear  HPI: Randall Calderon is a 54 y.o. male who presents for surgical treatment of left knee medial meniscal tear.  He denies any changes in medical history.  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    Sexual activity: Not on file  Other Topics Concern   Not on file  Social History Narrative   Not on file   Social Determinants of Health   Financial Resource Strain:    Difficulty of Paying Living Expenses: Not on file  Food Insecurity:    Worried About Running Out of Food in the Last Year: Not on file   Ran Out of Food in the Last Year: Not on file  Transportation Needs:    Lack of Transportation (Medical): Not on file   Lack of Transportation (Non-Medical): Not on file  Physical Activity:    Days of Exercise per Week: Not on file   Minutes of Exercise per Session: Not on file  Stress:    Feeling of Stress : Not on file  Social Connections:    Frequency of Communication with Friends and Family: Not on file   Frequency of Social Gatherings with Friends and Family: Not on file   Attends  Religious Services: Not on file   Active Member of Clubs or Organizations: Not on file   Attends Banker Meetings: Not on file   Marital Status: Not on file   Family History  Problem Relation Age of Onset   Stroke Mother    Cancer Mother    Throat cancer Father    Diabetes Sister    Hypertension Brother    No Known Allergies Prior to Admission medications   Medication Sig Start Date End Date Taking? Authorizing Provider  atorvastatin (LIPITOR) 40 MG tablet Take 1 tablet by mouth once daily 10/14/19  Yes Henson, Vickie L, NP-C  cyclobenzaprine (FLEXERIL) 5 MG tablet Take 1-2 tablets (5-10 mg total) by mouth 3 (three) times daily as needed for muscle spasms. 11/02/19  Yes Tarry Kos, MD  FIBER PO Take by mouth.   Yes [provider]  ibuprofen (ADVIL) 800 MG tablet Take 1 tablet (800 mg total) by mouth every 8 (eight) hours as needed. 11/02/19  Yes Tarry Kos, MD  lisinopril (ZESTRIL) 10 MG tablet Take 1 tablet (10 mg total) by mouth daily. 07/14/19  Yes Henson, Vickie L, NP-C  metFORMIN (GLUCOPHAGE) 500 MG tablet TAKE 1 TABLET BY MOUTH TWICE DAILY  WITH  A  MEAL. 05/28/19  Yes Henson, Vickie L, NP-C  Multiple Vitamin (ONE-A-DAY MENS PO) Take by mouth.   Yes [provider]  traMADol (ULTRAM) 50 MG tablet Take 1 tablet (50 mg total) by mouth 3 (three) times daily as needed. 10/08/19  Yes Cristie Hem, PA-C  diclofenac (VOLTAREN) 75 MG EC tablet Take 1 tablet (75 mg total) by mouth 2 (two) times daily as needed. 01/18/19   Henson, Vickie L, NP-C  Glucose Blood (BLOOD GLUCOSE TEST STRIPS) STRP Test once daily. Pt uses one touch verio flex meter 10/24/17   Henson, Vickie L, NP-C  ONETOUCH DELICA LANCETS FINE MISC Test once daily. Pt has a onetouch verio flex meter 10/24/17   Henson, Vickie L, NP-C  valACYclovir (VALTREX) 1000 MG tablet Take 1 tablet (1,000 mg total) by mouth daily. Patient taking differently: Take 1,000 mg by mouth as needed.  11/11/17    Henson, Vickie L, NP-C     Positive ROS: All other systems have been reviewed and were otherwise negative with the exception of those mentioned in the HPI and as above.  Physical Exam: General: Alert, no acute distress Cardiovascular: No pedal edema Respiratory: No cyanosis, no use of accessory musculature GI: abdomen soft Skin: No lesions in the area of chief complaint Neurologic: Sensation intact distally Psychiatric: Patient is competent for consent with normal mood and affect Lymphatic: no lymphedema  MUSCULOSKELETAL: exam stable  Assessment: left knee medial meniscal tear  Plan: Plan for Procedure(s): LEFT KNEE ARTHROSCOPY WITH MENISCAL ROOT REPAIR VS. DEBRIDEMENT  The risks benefits and alternatives were discussed with the patient including but not limited to the risks of nonoperative treatment, versus surgical intervention including infection, bleeding, nerve injury,  blood clots, cardiopulmonary complications, morbidity, mortality, among others, and they were willing to proceed.   Preoperative templating of the joint replacement has been completed, documented, and submitted to the Operating Room personnel in order to optimize intra-operative equipment management.   Glee Arvin, MD 11/26/2019 7:02 AM

## 2019-11-26 NOTE — Discharge Instructions (Signed)
  Post-operative patient instructions  Knee Arthroscopy   . Ice:  Place intermittent ice or cooler pack over your knee, 30 minutes on and 30 minutes off.  Continue this for the first 72 hours after surgery, then save ice for use after therapy sessions or on more active days.   . Weight:  You may bear weight on your leg as your symptoms allow. . Crutches:  Use crutches (or walker) to assist in walking until told to discontinue by your physical therapist or physician. This will help to reduce pain. . Strengthening:  Perform simple thigh squeezes (isometric quad contractions) and straight leg lifts as you are able (3 sets of 5 to 10 repetitions, 3 times a day).  For the leg lifts, have someone support under your ankle in the beginning until you have increased strength enough to do this on your own.  To help get started on thigh squeezes, place a pillow under your knee and push down on the pillow with back of knee (sometimes easier to do than with your leg fully straight). . Motion:  Perform gentle knee motion as tolerated - this is gentle bending and straightening of the knee. Seated heel slides: you can start by sitting in a chair, remove your brace, and gently slide your heel back on the floor - allowing your knee to bend. Have someone help you straighten your knee (or use your other leg/foot hooked under your ankle.  . Dressing:  Perform 1st dressing change at 2 days postoperative. A moderate amount of blood tinged drainage is to be expected.  So if you bleed through the dressing on the first or second day or if you have fevers, it is fine to change the dressing/check the wounds early and redress wound. Elevate your leg.  If it bleeds through again, or if the incisions are leaking frank blood, please call the office. May change dressing every 1-2 days thereafter to help watch wounds. Can purchase Tegaderm (or 3M Nexcare) water resistant dressings at local pharmacy / [facility]. . Shower:  Light shower is  ok after 2 days.  Please take shower, NO bath. Recover with gauze and ace wrap to help keep wounds protected.   . Pain medication:  A narcotic pain medication has been prescribed.  Take as directed.  Typically you need narcotic pain medication more regularly during the first 3 to 5 days after surgery.  Decrease your use of the medication as the pain improves.  Narcotics can sometimes cause constipation, even after a few doses.  If you have problems with constipation, you can take an over the counter stool softener or light laxative.  If you have persistent problems, please notify your physician's office. . Physical therapy: Additional activity guidelines to be provided by your physician or physical therapist at follow-up visits.  . Driving: Do not recommend driving x 2 weeks post surgical, especially if surgery performed on right side. Should not drive while taking narcotic pain medications. It typically takes at least 2 weeks to restore sufficient neuromuscular function for normal reaction times for driving safety.  . Call [phone] for questions or problems. Evenings you will be forwarded to the hospital operator.  Ask for the orthopaedic physician on call. Please call if you experience:    o Redness, foul smelling, or persistent drainage from the surgical site  o worsening knee pain and swelling not responsive to medication  o any calf pain and or swelling of the lower leg  o temperatures greater than   101.5 F o other questions or concerns   Thank you for allowing [place] to be a part of your care.   Post Anesthesia Home Care Instructions  Activity: Get plenty of rest for the remainder of the day. A responsible individual must stay with you for 24 hours following the procedure.  For the next 24 hours, DO NOT: -Drive a car -Advertising copywriter -Drink alcoholic beverages -Take any medication unless instructed by your physician -Make any legal decisions or sign important papers.  Meals: Start  with liquid foods such as gelatin or soup. Progress to regular foods as tolerated. Avoid greasy, spicy, heavy foods. If nausea and/or vomiting occur, drink only clear liquids until the nausea and/or vomiting subsides. Call your physician if vomiting continues.  Special Instructions/Symptoms: Your throat may feel dry or sore from the anesthesia or the breathing tube placed in your throat during surgery. If this causes discomfort, gargle with warm salt water. The discomfort should disappear within 24 hours.  May take Tylenol after 8pm, if needed.

## 2019-11-26 NOTE — Anesthesia Postprocedure Evaluation (Signed)
Anesthesia Post Note  Patient: Randall Calderon  Procedure(s) Performed: LEFT KNEE ARTHROSCOPY WITH MENISCAL ROOT REPAIR VS. DEBRIDEMENT (Left Knee)     Patient location during evaluation: PACU Anesthesia Type: General Level of consciousness: awake and alert Pain management: pain level controlled Vital Signs Assessment: post-procedure vital signs reviewed and stable Respiratory status: spontaneous breathing, nonlabored ventilation, respiratory function stable and patient connected to nasal cannula oxygen Cardiovascular status: blood pressure returned to baseline and stable Postop Assessment: no apparent nausea or vomiting Anesthetic complications: no   No complications documented.  Last Vitals:  Vitals:   11/26/19 1345 11/26/19 1418  BP: 126/84 125/79  Pulse: 68 61  Resp: 14 14  Temp:  36.6 C  SpO2: 97% 97%    Last Pain:  Vitals:   11/26/19 1418  TempSrc:   PainSc: 6                  Bayleigh Loflin P Timika Muench

## 2019-11-26 NOTE — Anesthesia Procedure Notes (Signed)
Procedure Name: LMA Insertion Date/Time: 11/26/2019 11:42 AM Performed by: Thornell Mule, CRNA Pre-anesthesia Checklist: Patient identified, Emergency Drugs available, Suction available and Patient being monitored Patient Re-evaluated:Patient Re-evaluated prior to induction Oxygen Delivery Method: Circle system utilized Preoxygenation: Pre-oxygenation with 100% oxygen Induction Type: IV induction Ventilation: Mask ventilation without difficulty LMA: LMA inserted LMA Size: 4.0 Number of attempts: 1 Placement Confirmation: positive ETCO2 Tube secured with: Tape Dental Injury: Teeth and Oropharynx as per pre-operative assessment

## 2019-11-26 NOTE — Anesthesia Preprocedure Evaluation (Addendum)
Anesthesia Evaluation  Patient identified by MRN, date of birth, ID band Patient awake    Reviewed: Patient's Chart, lab work & pertinent test results  Airway Mallampati: II  TM Distance: >3 FB Neck ROM: Full    Dental  (+) Partial Upper, Partial Lower   Pulmonary neg pulmonary ROS, former smoker,    Pulmonary exam normal        Cardiovascular hypertension, Pt. on medications  Rhythm:Regular Rate:Normal     Neuro/Psych negative neurological ROS  negative psych ROS   GI/Hepatic negative GI ROS, Neg liver ROS,   Endo/Other  diabetes, Well Controlled, Oral Hypoglycemic Agents  Renal/GU negative Renal ROS   HSV    Musculoskeletal  (+) Arthritis , Osteoarthritis,    Abdominal (+)  Abdomen: soft. Bowel sounds: normal.  Peds  Hematology  (+) anemia ,   Anesthesia Other Findings   Reproductive/Obstetrics                            Anesthesia Physical Anesthesia Plan  ASA: II  Anesthesia Plan: General   Post-op Pain Management:    Induction: Intravenous  PONV Risk Score and Plan: 2 and Ondansetron, Dexamethasone and Treatment may vary due to age or medical condition  Airway Management Planned: LMA and Mask  Additional Equipment: None  Intra-op Plan:   Post-operative Plan: Extubation in OR  Informed Consent: I have reviewed the patients History and Physical, chart, labs and discussed the procedure including the risks, benefits and alternatives for the proposed anesthesia with the patient or authorized representative who has indicated his/her understanding and acceptance.     Dental advisory given  Plan Discussed with: CRNA and Surgeon  Anesthesia Plan Comments: (Covid-19 Nucleic Acid Test Results Lab Results      Component                Value               Date                      [id number]              NEGATIVE            11/23/2019           )        Anesthesia  Quick Evaluation

## 2019-11-26 NOTE — Transfer of Care (Signed)
Immediate Anesthesia Transfer of Care Note  Patient: Randall Calderon  Procedure(s) Performed: LEFT KNEE ARTHROSCOPY WITH MENISCAL ROOT REPAIR VS. DEBRIDEMENT (Left Knee)  Patient Location: PACU  Anesthesia Type:General  Level of Consciousness: drowsy, patient cooperative and responds to stimulation  Airway & Oxygen Therapy: Patient Spontanous Breathing and Patient connected to face mask oxygen  Post-op Assessment: Report given to RN and Post -op Vital signs reviewed and stable  Post vital signs: Reviewed and stable  Last Vitals:  Vitals Value Taken Time  BP    Temp    Pulse 72 11/26/19 1257  Resp 0 11/26/19 1257  SpO2 100 % 11/26/19 1257  Vitals shown include unvalidated device data.  Last Pain:  Vitals:   11/26/19 1024  TempSrc: Oral  PainSc: 9       Patients Stated Pain Goal: 3 (11/26/19 1024)  Complications: No complications documented.

## 2019-11-26 NOTE — Op Note (Signed)
Surgery Date: 11/26/2019  PREOPERATIVE DIAGNOSES:  1. Left knee medial meniscal root tear 2. Left knee synovitis  POSTOPERATIVE DIAGNOSES:  same  PROCEDURES PERFORMED:  1. Left knee arthroscopy with major synovectomy 2. Left knee arthroscopy with arthroscopic medial meniscal root repair 3.  Left knee arthroscopy with arthroscopic chondroplasty medial femoral condyle and lateral femoral condyle  SURGEON: N. Glee Arvin, M.D.  ASSIST: Starlyn Skeans [place], [place]; necessary for the timely completion of procedure and due to complexity of procedure.  ANESTHESIA:  general  FLUIDS: Per anesthesia record.   ESTIMATED BLOOD LOSS: minimal  DESCRIPTION OF PROCEDURE: Randall Calderon is a 54 y.o.-year-old male with above mentioned conditions. Full discussion held regarding risks benefits alternatives and complications related surgical intervention. Conservative care options reviewed. All questions answered.  The patient was identified in the preoperative holding area and the operative extremity was marked. The patient was brought to the operating room and transferred to operating table in a supine position. Satisfactory general anesthesia was induced by anesthesiology.    Standard anterolateral, anteromedial arthroscopy portals were obtained. The anteromedial portal was obtained with a spinal needle for localization under direct visualization with subsequent diagnostic findings.   Incisions were made for standard knee arthroscopy portals.  Diagnostic knee arthroscopy was first performed.  Major synovectomy performed in all 3 compartments with oscillating shaver.  We placed a gentle valgus force on the knee and evaluate the medial compartment.  Medial femoral condyle had grade 3 changes in the weightbearing surface.  The medial meniscal root tear was identified and probed and was a full-thickness radial tear that rendered the meniscus unstable.  The torn portion of the medial meniscus was gently  debrided using oscillating shaver back to healthy tissue.  The stump was debrided away.  High-speed bur was used to prepare the bony surface as well as perform a eminence plasty to improve visualization.  Spinal needle was also used to fenestrate the MCL to improve visualization.  I then used a meniscal scorpion to luggage tag the root of the medial meniscus using a 0 fiber loop.  This was attached in 2 areas.  The sutures were brought out the medial portal.  The drill guide was then inserted into the knee and at the appropriate depth was then drilled through the anterior cortex of the tibia.  The flip cutter was then deployed to a 6 mm diameter and a 10 mm tunnel was back drilled.  The sutures were then passed out the tunnel and the meniscus was reduced using arthroscopic visualization.  The sutures were anchored into the anterior cortex of the tibia using a 4.75 mm swivel lock.  The meniscus was then probed and was stable.  The cruciates so were then evaluated and deemed to be stable.  The leg was placed in a figure 4 position and there was a focal area of grade III chondromalacia of the lateral femoral condyle for which chondroplasty was performed back to stable border.  The lateral meniscus was unremarkable.  The knee was placed in full extension and grade 3 changes were found on the femoral trochlea and grade 2 changes of the patellar surface.  Gutters were checked for loose bodies.  Excess fluid was removed from the knee joint.  Incisions were closed with interrupted nylon sutures.  Sterile dressings were applied.  Patient tolerated procedure well had no immediate complications.  Suprapatellar pouch and gutters: moderate synovitis or debris. Patella chondral surface: Grade 2 Trochlear chondral surface: Grade 3 Patellofemoral tracking: normal  Medial meniscus: large radial root tear.  Medial femoral condyle weight bearing surface: Grade 3 Medial tibial plateau: Grade 2 Anterior cruciate  ligament:stable Posterior cruciate ligament:stable Lateral meniscus: normal.   Lateral femoral condyle weight bearing surface: Focal contained area of grade 3 less than 1 cm Lateral tibial plateau: Grade 0  DISPOSITION: The patient was awakened from general anesthetic, extubated, taken to the recovery room in medically stable condition, no apparent complications. The patient may be weightbearing as tolerated to the operative lower extremity.  Range of motion of right knee as tolerated.  [name], MD [facility] 12:55 PM

## 2019-11-29 ENCOUNTER — Encounter (HOSPITAL_BASED_OUTPATIENT_CLINIC_OR_DEPARTMENT_OTHER): Payer: Self-pay | Admitting: Orthopaedic Surgery

## 2019-12-03 ENCOUNTER — Other Ambulatory Visit: Payer: Self-pay

## 2019-12-03 ENCOUNTER — Encounter: Payer: Self-pay | Admitting: Orthopaedic Surgery

## 2019-12-03 ENCOUNTER — Ambulatory Visit (INDEPENDENT_AMBULATORY_CARE_PROVIDER_SITE_OTHER): Payer: No Typology Code available for payment source | Admitting: Physician Assistant

## 2019-12-03 DIAGNOSIS — Z9889 Other specified postprocedural states: Secondary | ICD-10-CM

## 2019-12-03 NOTE — Progress Notes (Signed)
Post-Op Visit Note   Patient: Randall Calderon           Date of Birth: Oct 29, 1965           MRN: [id number] Visit Date: 12/03/2019 PCP: Avanell Shackleton, NP-C   Assessment & Plan:  Chief Complaint:  Chief Complaint  Patient presents with  . Left Knee - Pain   Visit Diagnoses:  1. S/P left knee arthroscopy     Plan: Patient is a very pleasant 54 year old who comes in today 1 week out left knee arthroscopic medial meniscus root repair.  He has been doing well.  He is ambulating with crutches.  He has been taking 2 oxycodone per day.  He is here with a nurse case manager as this is a Energy manager.  Examination of his left knee reveals well-healing surgical incisions without evidence of infection or cellulitis.  He does have some swelling.  Calf soft and nontender.  He is neurovascular intact distally.  Today, sutures were removed and Steri-Strips applied.  Formal physical therapy prescription was faxed to the nurse case manager and a home exercise program was provided to the patient to use until formal therapy has been approved.  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  He will follow up with [place] in 5 weeks time for recheck.  Call with concerns or questions. Follow-Up Instructions: Return in about 5 weeks (around 01/07/2020).   Orders:  No orders of the defined types were placed in this encounter.  No orders of the defined types were placed in this encounter.   Imaging: No new imaging  PMFS History: Patient Active Problem List   Diagnosis Date Noted  . Acute medial meniscal tear, left, initial encounter 11/02/2019  . Irritant contact dermatitis due to drug in contact with skin 04/15/2018  . 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 Anemia, unspecified 10/29/2017  . Elevated alkaline phosphatase measurement 10/29/2017  . Synovitis of knee 01/18/2017  . Unilateral  primary osteoarthritis, right knee 12/23/2016  . Controlled type 2 diabetes mellitus without complication, without long-term current use of insulin (HCC) 04/03/2016  . BPPV (benign paroxysmal positional vertigo) 04/03/2016  . Chronic right shoulder pain 04/03/2016  . Essential hypertension 04/03/2016   Past Medical History:  Diagnosis Date  . Anemia, unspecified 10/29/2017  . DM type 2 (diabetes mellitus, type 2) (HCC)   . Elevated alkaline phosphatase measurement 10/29/2017  . History of hemorrhoids   . 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 HTN (hypertension)   . Obesity (BMI 30.0-34.9)     Family History  Problem Relation Age of Onset  . Stroke Mother   . Cancer Mother   . Throat cancer Father   . Diabetes Sister   . Hypertension Brother     Past Surgical History:  Procedure Laterality Date  . KNEE ARTHROSCOPY WITH MENISCAL REPAIR Left 11/26/2019   Procedure: LEFT KNEE ARTHROSCOPY WITH MENISCAL ROOT REPAIR VS. DEBRIDEMENT;  Surgeon: Tarry Kos, MD;  Location: [facility];  Service: Orthopedics;  Laterality: Left;  . KNEE SURGERY     18 years ago  . TONSILLECTOMY     Social History   Occupational History  . Not on file  Tobacco Use  . Smoking status: Former Smoker    Years: 5.00  . Smokeless tobacco: Never Used  Substance and Sexual Activity  .  Alcohol use: No  . 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   Marland Kitchen Sexual activity: Not on file

## 2019-12-06 ENCOUNTER — Telehealth: Payer: Self-pay

## 2019-12-06 NOTE — Telephone Encounter (Signed)
Patient called in for refill for percocet .

## 2019-12-07 ENCOUNTER — Other Ambulatory Visit: Payer: Self-pay | Admitting: Physician Assistant

## 2019-12-07 MED ORDER — OXYCODONE-ACETAMINOPHEN 5-325 MG PO TABS
1.0000 | ORAL_TABLET | Freq: Three times a day (TID) | ORAL | 0 refills | Status: DC | PRN
Start: 2019-12-07 — End: 2020-05-05

## 2019-12-07 NOTE — Telephone Encounter (Signed)
Just sent in

## 2019-12-10 ENCOUNTER — Telehealth: Payer: Self-pay | Admitting: Orthopaedic Surgery

## 2019-12-10 NOTE — Telephone Encounter (Signed)
Received call from pts wife, Davina. Checking status of forms. Stating has left several msgs for [employer] and no one returns her calls. I informed forms were faxed to employer 10/26 and copy mailed to pt. I talked to pt and he gave verbal ar to email copy of forms to wife. I advised at his next appt, he needs to complete new HIPAA(DPR form. I emailed forms to wife

## 2019-12-12 ENCOUNTER — Other Ambulatory Visit: Payer: Self-pay | Admitting: Family Medicine

## 2019-12-17 ENCOUNTER — Other Ambulatory Visit: Payer: Self-pay | Admitting: Family Medicine

## 2020-01-11 ENCOUNTER — Ambulatory Visit (INDEPENDENT_AMBULATORY_CARE_PROVIDER_SITE_OTHER): Payer: No Typology Code available for payment source | Admitting: Orthopaedic Surgery

## 2020-01-11 ENCOUNTER — Encounter: Payer: Self-pay | Admitting: Orthopaedic Surgery

## 2020-01-11 ENCOUNTER — Encounter: Payer: Self-pay | Admitting: Family Medicine

## 2020-01-11 ENCOUNTER — Other Ambulatory Visit: Payer: Self-pay | Admitting: Family Medicine

## 2020-01-11 ENCOUNTER — Other Ambulatory Visit: Payer: Self-pay

## 2020-01-11 DIAGNOSIS — Z9889 Other specified postprocedural states: Secondary | ICD-10-CM

## 2020-01-11 DIAGNOSIS — S83242A Other tear of medial meniscus, current injury, left knee, initial encounter: Secondary | ICD-10-CM

## 2020-01-11 MED ORDER — ATORVASTATIN CALCIUM 40 MG PO TABS
40.0000 mg | ORAL_TABLET | Freq: Every day | ORAL | 0 refills | Status: DC
Start: 2020-01-11 — End: 2020-04-14

## 2020-01-11 NOTE — Progress Notes (Signed)
Post-Op Visit Note   Patient: Randall Calderon           Date of Birth: [birth year]           MRN: [id number] Visit Date: 01/11/2020 PCP: Avanell Shackleton, NP-C   Assessment & Plan:  Chief Complaint:  Chief Complaint  Patient presents with  . Left Knee - Follow-up   Visit Diagnoses:  1. S/P left knee arthroscopy   2. Acute medial meniscal tear, left, initial encounter     Plan:   Mr. Lieurance is 6 weeks status post left medial meniscal root repair and chondroplasty.  He started physical therapy last week and the plan is for 3 times a week and then eventually to twice a week.  He currently remains out of work.  He is overall feeling better.  He is taking ibuprofen once to twice a day and supplementing with Tylenol in between.  Overall he is feeling better.  Continues to wear the hinged knee brace.  He has some mild discomfort across the back of his knee.  Left knee shows fully healed surgical scars.  They are nontender.  Range of motion is from 5 to greater than 95 degrees.  Small joint effusion.  No signs of infection.  From my standpoint he is recovering well and improving appropriately.  We will keep him out of work for at least another 6 weeks so that he can get the proper outpatient PT.  There is no light duty or sedentary work available nor can he work from home.  We will recheck him in 6 weeks.  Nurse case manager was present today as well.   Follow-Up Instructions: Return in about 6 weeks (around 02/22/2020).   Orders:  No orders of the defined types were placed in this encounter.  No orders of the defined types were placed in this encounter.   Imaging: No results found.  PMFS History: Patient Active Problem List   Diagnosis Date Noted  . Acute medial meniscal tear, left, initial encounter 11/02/2019  . Irritant contact dermatitis due to drug in contact with skin 04/15/2018  . 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 Anemia, unspecified 10/29/2017  . Elevated alkaline phosphatase  measurement 10/29/2017  . Synovitis of knee 01/18/2017  . Unilateral primary osteoarthritis, right knee 12/23/2016  . Controlled type 2 diabetes mellitus without complication, without long-term current use of insulin (HCC) 04/03/2016  . BPPV (benign paroxysmal positional vertigo) 04/03/2016  . Chronic right shoulder pain 04/03/2016  . Essential hypertension 04/03/2016   Past Medical History:  Diagnosis Date  . Anemia, unspecified 10/29/2017  . DM type 2 (diabetes mellitus, type 2) (HCC)   . Elevated alkaline phosphatase measurement 10/29/2017  . History of hemorrhoids   . 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 HTN (hypertension)   . Obesity (BMI 30.0-34.9)     Family History  Problem Relation Age of Onset  . Stroke Mother   . Cancer Mother   . Throat cancer Father   . Diabetes Sister   . Hypertension Brother     Past Surgical History:  Procedure Laterality Date  . KNEE ARTHROSCOPY WITH MENISCAL REPAIR Left 11/26/2019   Procedure: LEFT KNEE ARTHROSCOPY WITH MENISCAL ROOT REPAIR VS. DEBRIDEMENT;  Surgeon: Tarry Kos, MD;  Location: [facility];  Service: Orthopedics;  Laterality: Left;  . KNEE SURGERY     18 years ago  . TONSILLECTOMY     Social History   Occupational History  . Not on file  Tobacco Use  . Smoking status: Former Smoker    Years: 5.00  . Smokeless tobacco: Never Used  Substance and Sexual Activity  . Alcohol use: No  . 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   Marland Kitchen Sexual activity: Not on file

## 2020-02-17 ENCOUNTER — Encounter: Payer: Self-pay | Admitting: Family Medicine

## 2020-02-17 ENCOUNTER — Other Ambulatory Visit: Payer: Self-pay | Admitting: Family Medicine

## 2020-02-22 ENCOUNTER — Ambulatory Visit (INDEPENDENT_AMBULATORY_CARE_PROVIDER_SITE_OTHER): Payer: No Typology Code available for payment source | Admitting: Physician Assistant

## 2020-02-22 ENCOUNTER — Other Ambulatory Visit: Payer: Self-pay

## 2020-02-22 ENCOUNTER — Encounter: Payer: Self-pay | Admitting: Orthopaedic Surgery

## 2020-02-22 DIAGNOSIS — Z9889 Other specified postprocedural states: Secondary | ICD-10-CM

## 2020-02-22 NOTE — Progress Notes (Signed)
Post-Op Visit Note   Patient: Randall Calderon           Date of Birth: Dec 06, 1965           MRN: [id number] Visit Date: 02/22/2020 PCP: Avanell Shackleton, NP-C    Assessment & Plan:  Chief Complaint:  Chief Complaint  Patient presents with  . Left Knee - Pain   Visit Diagnoses:  1. S/P left knee arthroscopy     Plan: Patient is a pleasant 55 year old gentleman who comes in today little over 6 weeks out left knee medial meniscus root repair.  He has been doing fairly well but has been progressing slower than anticipated.  He still exhibits mild to moderate pain at times.  He has been wearing a hinged knee brace which does seem to help.  Examination of his left knee reveals range of motion from 0 to about 95.  He does have tenderness along the medial joint line.  He is neurovascularly intact distally.  At this point, he will continue with physical therapy.  We did discuss stronger pain medication, but he does not believe that pain is inhibiting his progression with therapy.  He will follow-up with [place] in 6 weeks time for repeat evaluation.  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  In the meantime, he will remain out of work for another 6 weeks.  He will continue with physical therapy during this time.  Follow-Up Instructions: Return in about 6 weeks (around 04/04/2020).   Orders:  No orders of the defined types were placed in this encounter.  No orders of the defined types were placed in this encounter.   Imaging: No new imaging  PMFS History: Patient Active Problem List   Diagnosis Date Noted  . Acute medial meniscal tear, left, initial encounter 11/02/2019  . Irritant contact dermatitis due to drug in contact with skin 04/15/2018  . 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 Anemia, unspecified 10/29/2017  . Elevated alkaline phosphatase measurement 10/29/2017  . Synovitis of knee 01/18/2017  . Unilateral primary osteoarthritis, right knee  12/23/2016  . Controlled type 2 diabetes mellitus without complication, without long-term current use of insulin (HCC) 04/03/2016  . BPPV (benign paroxysmal positional vertigo) 04/03/2016  . Chronic right shoulder pain 04/03/2016  . Essential hypertension 04/03/2016   Past Medical History:  Diagnosis Date  . Anemia, unspecified 10/29/2017  . DM type 2 (diabetes mellitus, type 2) (HCC)   . Elevated alkaline phosphatase measurement 10/29/2017  . History of hemorrhoids   . 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 HTN (hypertension)   . Obesity (BMI 30.0-34.9)     Family History  Problem Relation Age of Onset  . Stroke Mother   . Cancer Mother   . Throat cancer Father   . Diabetes Sister   . Hypertension Brother     Past Surgical History:  Procedure Laterality Date  . KNEE ARTHROSCOPY WITH MENISCAL REPAIR Left 11/26/2019   Procedure: LEFT KNEE ARTHROSCOPY WITH MENISCAL ROOT REPAIR VS. DEBRIDEMENT;  Surgeon: Tarry Kos, MD;  Location: [facility];  Service: Orthopedics;  Laterality: Left;  . KNEE SURGERY     18 years ago  . TONSILLECTOMY     Social History   Occupational History  . Not on file  Tobacco Use  . Smoking status: Former Smoker    Years: 5.00  . Smokeless tobacco: Never Used  Substance and Sexual Activity  .  Alcohol use: No  . 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   Marland Kitchen Sexual activity: Not on file

## 2020-04-04 ENCOUNTER — Ambulatory Visit (INDEPENDENT_AMBULATORY_CARE_PROVIDER_SITE_OTHER): Payer: No Typology Code available for payment source | Admitting: Orthopaedic Surgery

## 2020-04-04 ENCOUNTER — Other Ambulatory Visit: Payer: Self-pay

## 2020-04-04 DIAGNOSIS — Z9889 Other specified postprocedural states: Secondary | ICD-10-CM | POA: Diagnosis not present

## 2020-04-04 DIAGNOSIS — S83242A Other tear of medial meniscus, current injury, left knee, initial encounter: Secondary | ICD-10-CM | POA: Diagnosis not present

## 2020-04-04 MED ORDER — METHYLPREDNISOLONE ACETATE 40 MG/ML IJ SUSP
40.0000 mg | INTRAMUSCULAR | Status: AC | PRN
  Administered 2020-04-04: 40 mg via INTRA_ARTICULAR

## 2020-04-04 MED ORDER — LIDOCAINE HCL 1 % IJ SOLN
2.0000 mL | INTRAMUSCULAR | Status: AC | PRN
  Administered 2020-04-04: 2 mL

## 2020-04-04 MED ORDER — BUPIVACAINE HCL 0.5 % IJ SOLN
2.0000 mL | INTRAMUSCULAR | Status: AC | PRN
  Administered 2020-04-04: 2 mL via INTRA_ARTICULAR

## 2020-04-04 NOTE — Progress Notes (Signed)
Office Visit Note   Patient: Randall Calderon           Date of Birth: [birth year]           MRN: [id number] Visit Date: 04/04/2020              Requested by: Avanell Shackleton, NP-C [address] [place],  [place] [address] PCP: Avanell Shackleton, NP-C   Assessment & Plan: Visit Diagnoses:  1. S/P left knee arthroscopy   2. Acute medial meniscal tear, left, initial encounter     Plan: Impression is 4 months status post left medial meniscal root repair. I feel that he has got Pez anserine bursitis which we injected today. I would like him to get another 4 weeks of physical therapy at twice week and followed by 4 weeks of work conditioning. I am fine with him returning back to sedentary work if available. We will recheck him in about 2 months.  Total face to face encounter time was greater than 25 minutes and over half of this time was spent in counseling and/or coordination of care.  Follow-Up Instructions: Return in about 2 months (around 06/02/2020).   Orders:  No orders of the defined types were placed in this encounter.  No orders of the defined types were placed in this encounter.     Procedures: Large Joint Inj: L knee on 04/04/2020 1:55 PM Details: 22 G needle Medications: 2 mL bupivacaine 0.5 %; 2 mL lidocaine 1 %; 40 mg methylPREDNISolone acetate 40 MG/ML Outcome: tolerated well, no immediate complications Patient was prepped and draped in the usual sterile fashion.       Clinical Data: No additional findings.   Subjective: Chief Complaint  Patient presents with  . Left Knee - Follow-up    Mr. Golaszewski returns to the today for follow-up 4 months status post left knee arthroscopy medial meniscal root repair. He continues to progress with physical therapy doing twice a week. He is seeing improvement. He still does not feel ready to return back to his previous job. Physical therapist is also pleased with his progress and is recommending more physical therapy. Mr.  Higinbotham feels pain in the posterior medial aspect of his knee that wraps around anteriorly. Denies any catching from what I can tell. Denies any swelling. He has trouble standing up due to pain. He does not endorse any pain is reminiscent of his original injury.   Review of Systems  Constitutional: Negative.   All other systems reviewed and are negative.    Objective: Vital Signs: There were no vitals taken for this visit.  Physical Exam Vitals and nursing note reviewed.  Constitutional:      Appearance: He is well-developed and well-nourished.  HENT:     Head: Normocephalic and atraumatic.  Eyes:     Pupils: Pupils are equal, round, and reactive to light.  Pulmonary:     Effort: Pulmonary effort is normal.  Abdominal:     Palpations: Abdomen is soft.  Musculoskeletal:        General: Normal range of motion.     Cervical back: Neck supple.  Skin:    General: Skin is warm.  Neurological:     Mental Status: He is alert and oriented to person, place, and time.  Psychiatric:        Mood and Affect: Mood and affect normal.        Behavior: Behavior normal.        Thought Content: Thought content  normal.        Judgment: Judgment normal.     Ortho Exam Left knee shows fully healed surgical scars. No joint effusion. Mild restriction in deep flexion. Full extension. Collaterals and cruciates are stable. He does have noticeable swelling along the course of the pes anserine bursa as well as tenderness. No joint line tenderness. Negative McMurray. Specialty Comments:  No specialty comments available.  Imaging: No results found.   PMFS History: Patient Active Problem List   Diagnosis Date Noted  . S/P left knee arthroscopy 04/04/2020  . Acute medial meniscal tear, left, initial encounter 11/02/2019  . Irritant contact dermatitis due to drug in contact with skin 04/15/2018  . 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 Anemia, unspecified 10/29/2017  . Elevated alkaline phosphatase  measurement 10/29/2017  . Synovitis of knee 01/18/2017  . Unilateral primary osteoarthritis, right knee 12/23/2016  . Controlled type 2 diabetes mellitus without complication, without long-term current use of insulin (HCC) 04/03/2016  . BPPV (benign paroxysmal positional vertigo) 04/03/2016  . Chronic right shoulder pain 04/03/2016  . Essential hypertension 04/03/2016   Past Medical History:  Diagnosis Date  . Anemia, unspecified 10/29/2017  . DM type 2 (diabetes mellitus, type 2) (HCC)   . Elevated alkaline phosphatase measurement 10/29/2017  . History of hemorrhoids   . 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 HTN (hypertension)   . Obesity (BMI 30.0-34.9)     Family History  Problem Relation Age of Onset  . Stroke Mother   . Cancer Mother   . Throat cancer Father   . Diabetes Sister   . Hypertension Brother     Past Surgical History:  Procedure Laterality Date  . KNEE ARTHROSCOPY WITH MENISCAL REPAIR Left 11/26/2019   Procedure: LEFT KNEE ARTHROSCOPY WITH MENISCAL ROOT REPAIR VS. DEBRIDEMENT;  Surgeon: Tarry Kos, MD;  Location: [facility];  Service: Orthopedics;  Laterality: Left;  . KNEE SURGERY     18 years ago  . TONSILLECTOMY     Social History   Occupational History  . Not on file  Tobacco Use  . Smoking status: Former Smoker    Years: 5.00  . Smokeless tobacco: Never Used  Substance and Sexual Activity  . Alcohol use: No  . 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   Marland Kitchen Sexual activity: Not on file

## 2020-04-13 ENCOUNTER — Encounter: Payer: Self-pay | Admitting: Family Medicine

## 2020-04-14 ENCOUNTER — Other Ambulatory Visit: Payer: Self-pay

## 2020-04-14 MED ORDER — LISINOPRIL 10 MG PO TABS
10.0000 mg | ORAL_TABLET | Freq: Every day | ORAL | 0 refills | Status: DC
Start: 2020-04-14 — End: 2020-07-18

## 2020-04-14 MED ORDER — LISINOPRIL 10 MG PO TABS
10.0000 mg | ORAL_TABLET | Freq: Every day | ORAL | 0 refills | Status: DC
Start: 2020-04-14 — End: 2020-04-14

## 2020-04-14 MED ORDER — ATORVASTATIN CALCIUM 40 MG PO TABS
40.0000 mg | ORAL_TABLET | Freq: Every day | ORAL | 0 refills | Status: DC
Start: 2020-04-14 — End: 2020-05-22

## 2020-05-05 ENCOUNTER — Ambulatory Visit: Payer: Managed Care, Other (non HMO) | Admitting: Family Medicine

## 2020-05-05 ENCOUNTER — Other Ambulatory Visit: Payer: Self-pay

## 2020-05-05 ENCOUNTER — Encounter: Payer: Self-pay | Admitting: Family Medicine

## 2020-05-05 VITALS — BP 160/98 | HR 61 | Temp 96.0°F | Wt 227.4 lb

## 2020-05-05 DIAGNOSIS — I10 Essential (primary) hypertension: Secondary | ICD-10-CM

## 2020-05-05 DIAGNOSIS — E119 Type 2 diabetes mellitus without complications: Secondary | ICD-10-CM

## 2020-05-05 DIAGNOSIS — Z1159 Encounter for screening for other viral diseases: Secondary | ICD-10-CM

## 2020-05-05 DIAGNOSIS — Z125 Encounter for screening for malignant neoplasm of prostate: Secondary | ICD-10-CM | POA: Diagnosis not present

## 2020-05-05 DIAGNOSIS — Z0000 Encounter for general adult medical examination without abnormal findings: Secondary | ICD-10-CM | POA: Diagnosis not present

## 2020-05-05 DIAGNOSIS — E669 Obesity, unspecified: Secondary | ICD-10-CM | POA: Diagnosis not present

## 2020-05-05 DIAGNOSIS — B009 Herpesviral infection, unspecified: Secondary | ICD-10-CM

## 2020-05-05 DIAGNOSIS — Z79899 Other long term (current) drug therapy: Secondary | ICD-10-CM

## 2020-05-05 DIAGNOSIS — H6122 Impacted cerumen, left ear: Secondary | ICD-10-CM

## 2020-05-05 LAB — POCT URINALYSIS DIP (PROADVANTAGE DEVICE)
Bilirubin, UA: NEGATIVE
Blood, UA: NEGATIVE
Glucose, UA: NEGATIVE mg/dL
Ketones, POC UA: NEGATIVE mg/dL
Leukocytes, UA: NEGATIVE
Nitrite, UA: NEGATIVE
Protein Ur, POC: NEGATIVE mg/dL
Specific Gravity, Urine: 1.025
Urobilinogen, Ur: 0.2
pH, UA: 6 (ref 5.0–8.0)

## 2020-05-05 MED ORDER — VALACYCLOVIR HCL 1 G PO TABS: 1000.0000 mg | ORAL_TABLET | ORAL | 5 refills | Status: DC | PRN

## 2020-05-05 NOTE — Progress Notes (Signed)
Subjective:    Patient ID: Randall Calderon, male    DOB: 11-28-1965, 55 y.o.   MRN: [id number]  HPI Chief Complaint  Patient presents with  . Annual Exam    fasting   He is here for a complete physical exam and to follow up on chronic health conditions.  Last CPE: 04/29/2019  Other providers: Orthopedist-  Dr. Roda Shutters   DM- Last Hgb A1c 6.0% on 04/29/2019  Taking metformin daily  Has not been doing BS checks  Taking statin daily   HSV breakouts- no recent outbreaks. States he lost his medication and requests refill.    HTN- on lisinopril No recent BP at home.   Social history: Lives with wife, son Randall Calderon who is 58, works for [employer]. Local driving. Gets DOT exams.  Denies smoking, drinking alcohol, drug use Diet: eating more than usual and has gained weight  Exercise: walking some and biking   Immunizations: Zoster, 2 Covid vaccines, 1 pneumonia in past. Declines any vaccines today   Health maintenance:  Colonoscopy: 07/2014 at [facility]  Last PSA: 2021 Last Dental Exam: teeth removed and partials  Last Eye Exam: March 30th scheduled.   Wears seatbelt always, smoke detectors in home and functioning, does not text while driving, feels safe in home environment.  Reviewed allergies, medications, past medical, surgical, family, and social history.   Review of Systems Review of Systems Constitutional: -fever, -chills, -sweats, -unexpected weight change,-fatigue ENT: -runny nose, -ear pain, -sore throat Cardiology:  -chest pain, -palpitations, -edema Respiratory: -cough, -shortness of breath, -wheezing Gastroenterology: -abdominal pain, -nausea, -vomiting, -diarrhea, -constipation  Hematology: -bleeding or bruising problems Musculoskeletal: -arthralgias, -myalgias, -joint swelling, -back pain Ophthalmology: -vision changes Urology: -dysuria, -difficulty urinating, -hematuria, -urinary frequency, -urgency Neurology: -headache, -weakness, -tingling, -numbness        Objective:   Physical Exam BP (!) 160/98   Pulse 61   Temp (!) 96 F (35.6 C)   Wt 227 lb 6.4 oz (103.1 kg)   SpO2 97%   BMI 33.58 kg/m   General Appearance:    Alert, cooperative, no distress, appears stated age  Head:    Normocephalic, without obvious abnormality, atraumatic  Eyes:    PERRL, conjunctiva/corneas clear, EOM's intact  Ears:    Left ear canal with cerumen impaction on initial exam. Normal TM's and external ear canals post left ear lavage.   Nose:   Mask on   Throat:   Mask on   Neck:   Supple, no lymphadenopathy;  thyroid:  no   enlargement/tenderness/nodules; no JVD  Back:    Spine nontender, no curvature, ROM normal, no CVA     tenderness  Lungs:     Clear to auscultation bilaterally without wheezes, rales or     ronchi; respirations unlabored  Chest Wall:    No tenderness or deformity   Heart:    Regular rate and rhythm, S1 and S2 normal, no murmur, rub   or gallop  Breast Exam:    No chest wall tenderness, masses or gynecomastia  Abdomen:     Soft, non-tender, nondistended, normoactive bowel sounds,    no masses, no hepatosplenomegaly  Genitalia:    declines.     Extremities:   No clubbing, cyanosis or edema  Pulses:   2+ and symmetric all extremities  Skin:   Skin color, texture, turgor normal, no rashes or lesions  Lymph nodes:   Cervical, supraclavicular, and axillary nodes normal  Neurologic:   CNII-XII intact, normal strength, sensation  and gait; reflexes 2+ and symmetric throughout          Psych:   Normal mood, affect, hygiene and grooming.        Assessment & Plan:  Routine general medical examination at a health care facility - Plan: CBC with Differential, Comprehensive metabolic panel, Lipid panel, POCT Urinalysis DIP (Proadvantage Device) -Here today for fasting CPE.  Preventive health care reviewed.  Counseling on healthy lifestyle including diet and exercise.  Recommend regular dental and diabetic eye exams.  Discussed safety and health  promotion.  Controlled type 2 diabetes mellitus without complication, without long-term current use of insulin (HCC) - Plan: CBC with Differential, Comprehensive metabolic panel, Lipid panel, Hemoglobin A1c, Microalbumin / creatinine urine ratio -He has not been checking his blood sugar at home.  Reports good compliance with Metformin.  Diet has been not quite as healthy and he has gained weight, approximately 7 pounds per our scales.  Encouraged him to work on his diet and increase physical activity. He is on lisinopril and a statin. Diabetic foot exam done.  Urine microalbumin ordered. Follow-up pending hemoglobin A1c and other labs.  Essential hypertension - Plan: CBC with Differential, Comprehensive metabolic panel -He is aware that his blood pressure is significantly elevated today in the office.  He reports increased stress and getting bad news from a family member yesterday.  Continue lisinopril for now and pay closer attention to his diet.  Encouraged him to check his blood pressure at home and let me know if it is staying elevated.  Consider increasing lisinopril dose.  Obesity (BMI 30-39.9) -In-depth counseling on healthy diet and exercise as well as good eating habits for weight loss.  Discussed that this would also help with his knee pain.  HSV infection - Plan: valACYclovir (VALTREX) 1000 MG tablet -Refilled medication.  No recent outbreaks.  Screening for prostate cancer - Plan: PSA  Need for hepatitis C screening test - Plan: Hepatitis C antibody -Done per screening guidelines  Medication management - Plan: Lipid panel  Impacted cerumen of left ear -Disimpaction performed by Selena Batten, CMA, irrigation done due to cerumen impaction.  Patient tolerated this well and TM normal post lavage.  He noted significant improvement immediately following procedure

## 2020-05-05 NOTE — Patient Instructions (Addendum)
Keep an eye on your blood pressure at home since it is elevated today.  Try to cut back on carbohydrates (potatoes, pasta, rice, bread, and sugar).  Also, watch your portion sizes and daily caloric intake.   Looks for ways to exercise that is low impact and good for your knee.   Continue current medications for now.   We will be in touch with your lab results.     Preventive Care 56-56 Years Old, Male Preventive care refers to lifestyle choices and visits with your health care provider that can promote health and wellness. This includes:  A yearly physical exam. This is also called an annual wellness visit.  Regular dental and eye exams.  Immunizations.  Screening for certain conditions.  Healthy lifestyle choices, such as: ? Eating a healthy diet. ? Getting regular exercise. ? Not using drugs or products that contain nicotine and tobacco. ? Limiting alcohol use. What can I expect for my preventive care visit? Physical exam Your health care provider will check your:  Height and weight. These may be used to calculate your BMI (body mass index). BMI is a measurement that tells if you are at a healthy weight.  Heart rate and blood pressure.  Body temperature.  Skin for abnormal spots. Counseling Your health care provider may ask you questions about your:  Past medical problems.  Family's medical history.  Alcohol, tobacco, and drug use.  Emotional well-being.  Home life and relationship well-being.  Sexual activity.  Diet, exercise, and sleep habits.  Work and work Astronomer.  Access to firearms. What immunizations do I need? Vaccines are usually given at various ages, according to a schedule. Your health care provider will recommend vaccines for you based on your age, medical history, and lifestyle or other factors, such as travel or where you work.   What tests do I need? Blood tests  Lipid and cholesterol levels. These may be checked every 5 years,  or more often if you are over 30 years old.  Hepatitis C test.  Hepatitis B test. Screening  Lung cancer screening. You may have this screening every year starting at age 36 if you have a 30-pack-year history of smoking and currently smoke or have quit within the past 15 years.  Prostate cancer screening. Recommendations will vary depending on your family history and other risks.  Genital exam to check for testicular cancer or hernias.  Colorectal cancer screening. ? All adults should have this screening starting at age 25 and continuing until age 45. ? Your health care provider may recommend screening at age 16 if you are at increased risk. ? You will have tests every 1-10 years, depending on your results and the type of screening test.  Diabetes screening. ? This is done by checking your blood sugar (glucose) after you have not eaten for a while (fasting). ? You may have this done every 1-3 years.  STD (sexually transmitted disease) testing, if you are at risk. Follow these instructions at home: Eating and drinking  Eat a diet that includes fresh fruits and vegetables, whole grains, lean protein, and low-fat dairy products.  Take vitamin and mineral supplements as recommended by your health care provider.  Do not drink alcohol if your health care provider tells you not to drink.  If you drink alcohol: ? Limit how much you have to 0-2 drinks a day. ? Be aware of how much alcohol is in your drink. In the [place] one drink equals one 12 oz  bottle of beer (355 mL), one 5 oz glass of wine (148 mL), or one 1 oz glass of hard liquor (44 mL).   Lifestyle  Take daily care of your teeth and gums. Brush your teeth every morning and night with fluoride toothpaste. Floss one time each day.  Stay active. Exercise for at least 30 minutes 5 or more days each week.  Do not use any products that contain nicotine or tobacco, such as cigarettes, e-cigarettes, and chewing tobacco. If you need  help quitting, ask your health care provider.  Do not use drugs.  If you are sexually active, practice safe sex. Use a condom or other form of protection to prevent STIs (sexually transmitted infections).  If told by your health care provider, take low-dose aspirin daily starting at age 32.  Find healthy ways to cope with stress, such as: ? Meditation, yoga, or listening to music. ? Journaling. ? Talking to a trusted person. ? Spending time with friends and family. Safety  Always wear your seat belt while driving or riding in a vehicle.  Do not drive: ? If you have been drinking alcohol. Do not ride with someone who has been drinking. ? When you are tired or distracted. ? While texting.  Wear a helmet and other protective equipment during sports activities.  If you have firearms in your house, make sure you follow all gun safety procedures. What's next?  Go to your health care provider once a year for an annual wellness visit.  Ask your health care provider how often you should have your eyes and teeth checked.  Stay up to date on all vaccines. This information is not intended to replace advice given to you by your health care provider. Make sure you discuss any questions you have with your health care provider. Document Revised: 10/27/2018 Document Reviewed: 01/22/2018 Elsevier Patient Education  2021 [employer].

## 2020-05-06 LAB — LIPID PANEL
Chol/HDL Ratio: 3.2 ratio (ref 0.0–5.0)
Cholesterol, Total: 133 mg/dL (ref 100–199)
HDL: 41 mg/dL (ref >39)
LDL Chol Calc (NIH): 79 mg/dL (ref 0–99)
Triglycerides: 65 mg/dL (ref 0–149)
VLDL Cholesterol Cal: 13 mg/dL (ref 5–40)

## 2020-05-06 LAB — COMPREHENSIVE METABOLIC PANEL
ALT: 50 IU/L — ABNORMAL HIGH (ref 0–44)
AST: 32 IU/L (ref 0–40)
Albumin/Globulin Ratio: 1.6 (ref 1.2–2.2)
Albumin: 4.6 g/dL (ref 3.8–4.9)
Alkaline Phosphatase: 198 IU/L — ABNORMAL HIGH (ref 44–121)
BUN/Creatinine Ratio: 8 — ABNORMAL LOW (ref 9–20)
BUN: 10 mg/dL (ref 6–24)
Bilirubin Total: 0.3 mg/dL (ref 0.0–1.2)
CO2: 21 mmol/L (ref 20–29)
Calcium: 9.4 mg/dL (ref 8.7–10.2)
Chloride: 102 mmol/L (ref 96–106)
Creatinine, Ser: 1.19 mg/dL (ref 0.76–1.27)
Globulin, Total: 2.8 g/dL (ref 1.5–4.5)
Glucose: 132 mg/dL — ABNORMAL HIGH (ref 65–99)
Potassium: 4 mmol/L (ref 3.5–5.2)
Sodium: 139 mmol/L (ref 134–144)
Total Protein: 7.4 g/dL (ref 6.0–8.5)
eGFR: 72 mL/min/{1.73_m2} (ref >59)

## 2020-05-06 LAB — CBC WITH DIFFERENTIAL/PLATELET
Basophils Absolute: 0 10*3/uL (ref 0.0–0.2)
Basos: 1 %
EOS (ABSOLUTE): 0.1 10*3/uL (ref 0.0–0.4)
Eos: 3 %
Hematocrit: 37.6 % (ref 37.5–51.0)
Hemoglobin: 12.6 g/dL — ABNORMAL LOW (ref 13.0–17.7)
Immature Grans (Abs): 0 10*3/uL (ref 0.0–0.1)
Immature Granulocytes: 0 %
Lymphocytes Absolute: 2.3 10*3/uL (ref 0.7–3.1)
Lymphs: 52 %
MCH: 29.1 pg (ref 26.6–33.0)
MCHC: 33.5 g/dL (ref 31.5–35.7)
MCV: 87 fL (ref 79–97)
Monocytes Absolute: 0.5 10*3/uL (ref 0.1–0.9)
Monocytes: 11 %
Neutrophils Absolute: 1.4 10*3/uL (ref 1.4–7.0)
Neutrophils: 33 %
Platelets: 224 10*3/uL (ref 150–450)
RBC: 4.33 x10E6/uL (ref 4.14–5.80)
RDW: 12.2 % (ref 11.6–15.4)
WBC: 4.3 10*3/uL (ref 3.4–10.8)

## 2020-05-06 LAB — HEMOGLOBIN A1C
Est. average glucose Bld gHb Est-mCnc: 174 mg/dL
Hgb A1c MFr Bld: 7.7 % — ABNORMAL HIGH (ref 4.8–5.6)

## 2020-05-06 LAB — MICROALBUMIN / CREATININE URINE RATIO
Creatinine, Urine: 176.3 mg/dL
Microalb/Creat Ratio: 3 mg/g creat (ref 0–29)
Microalbumin, Urine: 5.2 ug/mL

## 2020-05-06 LAB — PSA: Prostate Specific Ag, Serum: 0.7 ng/mL (ref 0.0–4.0)

## 2020-05-06 LAB — HEPATITIS C ANTIBODY: Hep C Virus Ab: <0.1 s/co ratio (ref 0.0–0.9)

## 2020-05-07 NOTE — Progress Notes (Signed)
His alkaline phosphatase is much higher now. I would like for him to see GI due to this. Please let him know and if a referral is needed then ok to initiate it. I am increasing his metformin to 1,000 mg twice daily due to Hgb A1c now 7.7%. needs a 4 months diabetes follow up

## 2020-05-22 ENCOUNTER — Other Ambulatory Visit: Payer: Self-pay | Admitting: Family Medicine

## 2020-05-22 ENCOUNTER — Encounter: Payer: Self-pay | Admitting: Internal Medicine

## 2020-05-30 ENCOUNTER — Other Ambulatory Visit: Payer: Self-pay

## 2020-05-30 ENCOUNTER — Ambulatory Visit (INDEPENDENT_AMBULATORY_CARE_PROVIDER_SITE_OTHER): Payer: No Typology Code available for payment source | Admitting: Orthopaedic Surgery

## 2020-05-30 DIAGNOSIS — Z9889 Other specified postprocedural states: Secondary | ICD-10-CM

## 2020-05-30 DIAGNOSIS — S83242A Other tear of medial meniscus, current injury, left knee, initial encounter: Secondary | ICD-10-CM

## 2020-05-30 NOTE — Progress Notes (Signed)
Office Visit Note   Patient: Randall Calderon           Date of Birth: 1965/09/02           MRN: [id number] Visit Date: 05/30/2020              Requested by: Avanell Shackleton, NP-C [address] [place],  [place] [address] PCP: Avanell Shackleton, NP-C   Assessment & Plan: Visit Diagnoses:  1. S/P left knee arthroscopy   2. Acute medial meniscal tear, left, initial encounter     Plan: At this point [name] is 69-month status post medial meniscal root repair and continues to have symptoms and he is having trouble progressing through physical therapy on to work conditioning.  I am concerned that the meniscal repair may not have healed therefore I will need an MRI to evaluate for this.  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  Sedentary work note provided today.  MRI ordered today.  We will see him back after the MRI.  Case manager was present during the entire encounter.  Total face to face encounter time was greater than 25 minutes and over half of this time was spent in counseling and/or coordination of care.  Follow-Up Instructions: Return if symptoms worsen or fail to improve.   Orders:  No orders of the defined types were placed in this encounter.  No orders of the defined types were placed in this encounter.     Procedures: No procedures performed   Clinical Data: No additional findings.   Subjective: Chief Complaint  Patient presents with  . Left Knee - Follow-up    [name] returns today for follow-up of his left knee meniscal root repair.  He is 6 months from the surgery.  He has gotten about 8 weeks of physical therapy.  Because of the pain he was not able to progress onto work conditioning which is set to start tomorrow.  He states that he is not any better but not any worse.  Continues to do sedentary work.  Nurse case manager is present today.  Wife also present today.   Review of  Systems  Constitutional: Negative.   All other systems reviewed and are negative.    Objective: Vital Signs: There were no vitals taken for this visit.  Physical Exam Vitals and nursing note reviewed.  Constitutional:      Appearance: He is well-developed.  Pulmonary:     Effort: Pulmonary effort is normal.  Abdominal:     Palpations: Abdomen is soft.  Skin:    General: Skin is warm.  Neurological:     Mental Status: He is alert and oriented to person, place, and time.  Psychiatric:        Behavior: Behavior normal.        Thought Content: Thought content normal.        Judgment: Judgment normal.     Ortho Exam Left knee shows 5 degrees shy of full extension.  Surgical scars are fully healed.  The proximal tibia incision is slightly tender to palpation.  Medial joint line tenderness.  Pain with McMurray testing of the medial joint line.  No joint effusion.  Mild popping with range of motion at the patellofemoral compartment. Specialty Comments:  No specialty comments available.  Imaging: No results found.   PMFS History: Patient Active Problem List   Diagnosis Date Noted  . Obesity (BMI 30-39.9) 05/05/2020  .  S/P left knee arthroscopy 04/04/2020  . Acute medial meniscal tear, left, initial encounter 11/02/2019  . Irritant contact dermatitis due to drug in contact with skin 04/15/2018  . 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 Anemia, unspecified 10/29/2017  . Elevated alkaline phosphatase measurement 10/29/2017  . Synovitis of knee 01/18/2017  . Unilateral primary osteoarthritis, right knee 12/23/2016  . Controlled type 2 diabetes mellitus without complication, without long-term current use of insulin (HCC) 04/03/2016  . BPPV (benign paroxysmal positional vertigo) 04/03/2016  . Chronic right shoulder pain 04/03/2016  . Essential hypertension 04/03/2016   Past Medical History:  Diagnosis Date  . Anemia, unspecified 10/29/2017  . DM type 2 (diabetes mellitus, type 2) (HCC)    . Elevated alkaline phosphatase measurement 10/29/2017  . History of hemorrhoids   . 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 HTN (hypertension)   . Obesity (BMI 30.0-34.9)     Family History  Problem Relation Age of Onset  . Stroke Mother   . Cancer Mother   . Throat cancer Father   . Diabetes Sister   . Hypertension Brother     Past Surgical History:  Procedure Laterality Date  . KNEE ARTHROSCOPY WITH MENISCAL REPAIR Left 11/26/2019   Procedure: LEFT KNEE ARTHROSCOPY WITH MENISCAL ROOT REPAIR VS. DEBRIDEMENT;  Surgeon: Tarry Kos, MD;  Location: [facility];  Service: Orthopedics;  Laterality: Left;  . KNEE SURGERY     18 years ago  . TONSILLECTOMY     Social History   Occupational History  . Not on file  Tobacco Use  . Smoking status: Former Smoker    Years: 5.00  . Smokeless tobacco: Never Used  Substance and Sexual Activity  . Alcohol use: No  . 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   Marland Kitchen Sexual activity: Not on file

## 2020-06-19 ENCOUNTER — Other Ambulatory Visit: Payer: Self-pay

## 2020-06-19 DIAGNOSIS — Z9889 Other specified postprocedural states: Secondary | ICD-10-CM

## 2020-06-20 ENCOUNTER — Ambulatory Visit: Payer: Managed Care, Other (non HMO) | Admitting: Orthopaedic Surgery

## 2020-06-23 ENCOUNTER — Encounter: Payer: Self-pay | Admitting: Orthopaedic Surgery

## 2020-06-23 ENCOUNTER — Ambulatory Visit (INDEPENDENT_AMBULATORY_CARE_PROVIDER_SITE_OTHER): Payer: No Typology Code available for payment source | Admitting: Orthopaedic Surgery

## 2020-06-23 DIAGNOSIS — Z9889 Other specified postprocedural states: Secondary | ICD-10-CM

## 2020-06-23 DIAGNOSIS — S83242A Other tear of medial meniscus, current injury, left knee, initial encounter: Secondary | ICD-10-CM | POA: Diagnosis not present

## 2020-06-23 DIAGNOSIS — M1712 Unilateral primary osteoarthritis, left knee: Secondary | ICD-10-CM | POA: Diagnosis not present

## 2020-06-23 NOTE — Progress Notes (Addendum)
Office Visit Note   Patient: Randall Calderon           Date of Birth: 1965-11-03           MRN: [id number] Visit Date: 06/23/2020              Requested by: Avanell Shackleton, NP-C [address] [place],  [place] [address] PCP: Avanell Shackleton, NP-C   Assessment & Plan: Visit Diagnoses:  1. Primary osteoarthritis of left knee     Plan: MRI from [facility] reveals a read tear of the meniscal root repair.  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  These findings were reviewed with Randall Calderon and his wife and based on these findings I have recommended a total knee replacement permanent and durable solution for pain relief and improved ability to perform daily activities and improve quality of life.  I feel that attempting a redo repair would likely fail and a meniscectomy would only cause his degenerative joint disease to advance even more rapidly.  They agree with my recommendation and have elected to proceed with a left total knee replacement pending Worker's Comp. approval.  Risk benefits rehab recovery reviewed with the patient today.  Denies history of DVT or nickel allergy.  For now he will need to continue to remain out of work until approximately 3 months after the knee replacement.  Questions encouraged and answered.  Once he has gotten approval from [employer]. he will need to return back to our office for an A1c level and as well as 2 view x-rays of the left knee for preoperative planning.  Total face to face encounter time was greater than 25 minutes and over half of this time was spent in counseling and/or coordination of care.  Follow-Up Instructions: Return for postop.   Orders:  No orders of the defined types were placed in this encounter.  No orders of the defined types were placed in this encounter.     Procedures: No procedures performed   Clinical Data: No additional findings.   Subjective: Chief  Complaint  Patient presents with  . Left Knee - Pain    Randall Calderon returns today for MRI review of the left knee.  Denies any changes in his his knee symptoms.   Review of Systems  Constitutional: Negative.   All other systems reviewed and are negative.    Objective: Vital Signs: There were no vitals taken for this visit.  Physical Exam Vitals and nursing note reviewed.  Constitutional:      Appearance: He is well-developed.  Pulmonary:     Effort: Pulmonary effort is normal.  Abdominal:     Palpations: Abdomen is soft.  Skin:    General: Skin is warm.  Neurological:     Mental Status: He is alert and oriented to person, place, and time.  Psychiatric:        Behavior: Behavior normal.        Thought Content: Thought content normal.        Judgment: Judgment normal.     Ortho Exam Left knee exam . He has small joint effusion.  Fully healed surgical scars.  He has pain along the medial joint line.  Pain and crepitus with range of motion.  Range of motion is 5 to 100 with pain.  Collaterals and cruciates are stable. Specialty Comments:  No specialty comments available.  Imaging: No results found.   PMFS History: Patient  Active Problem List   Diagnosis Date Noted  . Primary osteoarthritis of left knee 06/23/2020  . Obesity (BMI 30-39.9) 05/05/2020  . S/P left knee arthroscopy 04/04/2020  . Acute medial meniscal tear, left, initial encounter 11/02/2019  . Irritant contact dermatitis due to drug in contact with skin 04/15/2018  . 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 Anemia, unspecified 10/29/2017  . Elevated alkaline phosphatase measurement 10/29/2017  . Synovitis of knee 01/18/2017  . Unilateral primary osteoarthritis, right knee 12/23/2016  . Controlled type 2 diabetes mellitus without complication, without long-term current use of insulin (HCC) 04/03/2016  . BPPV (benign paroxysmal positional vertigo) 04/03/2016  . Chronic right shoulder pain 04/03/2016  . Essential  hypertension 04/03/2016   Past Medical History:  Diagnosis Date  . Anemia, unspecified 10/29/2017  . DM type 2 (diabetes mellitus, type 2) (HCC)   . Elevated alkaline phosphatase measurement 10/29/2017  . History of hemorrhoids   . 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 HTN (hypertension)   . Obesity (BMI 30.0-34.9)     Family History  Problem Relation Age of Onset  . Stroke Mother   . Cancer Mother   . Throat cancer Father   . Diabetes Sister   . Hypertension Brother     Past Surgical History:  Procedure Laterality Date  . KNEE ARTHROSCOPY WITH MENISCAL REPAIR Left 11/26/2019   Procedure: LEFT KNEE ARTHROSCOPY WITH MENISCAL ROOT REPAIR VS. DEBRIDEMENT;  Surgeon: Tarry Kos, MD;  Location: [facility];  Service: Orthopedics;  Laterality: Left;  . KNEE SURGERY     18 years ago  . TONSILLECTOMY     Social History   Occupational History  . Not on file  Tobacco Use  . Smoking status: Former Smoker    Years: 5.00  . Smokeless tobacco: Never Used  Substance and Sexual Activity  . Alcohol use: No  . 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   Marland Kitchen Sexual activity: Not on file

## 2020-07-05 ENCOUNTER — Other Ambulatory Visit: Payer: Self-pay | Admitting: Orthopaedic Surgery

## 2020-07-13 ENCOUNTER — Other Ambulatory Visit: Payer: Self-pay | Admitting: Orthopaedic Surgery

## 2020-07-18 ENCOUNTER — Other Ambulatory Visit: Payer: Self-pay | Admitting: Family Medicine

## 2020-07-22 ENCOUNTER — Other Ambulatory Visit: Payer: Self-pay | Admitting: Family Medicine

## 2020-08-03 ENCOUNTER — Other Ambulatory Visit: Payer: Self-pay | Admitting: Family Medicine

## 2020-08-24 ENCOUNTER — Ambulatory Visit: Payer: Managed Care, Other (non HMO)

## 2020-09-11 ENCOUNTER — Ambulatory Visit (INDEPENDENT_AMBULATORY_CARE_PROVIDER_SITE_OTHER): Payer: No Typology Code available for payment source

## 2020-09-11 ENCOUNTER — Other Ambulatory Visit: Payer: Self-pay

## 2020-09-11 ENCOUNTER — Ambulatory Visit (INDEPENDENT_AMBULATORY_CARE_PROVIDER_SITE_OTHER): Payer: No Typology Code available for payment source | Admitting: Orthopaedic Surgery

## 2020-09-11 DIAGNOSIS — S83242A Other tear of medial meniscus, current injury, left knee, initial encounter: Secondary | ICD-10-CM

## 2020-09-11 DIAGNOSIS — M1712 Unilateral primary osteoarthritis, left knee: Secondary | ICD-10-CM | POA: Diagnosis not present

## 2020-09-12 ENCOUNTER — Ambulatory Visit: Payer: Managed Care, Other (non HMO) | Admitting: Family Medicine

## 2020-09-12 LAB — HEMOGLOBIN A1C
Hgb A1c MFr Bld: 6.2 % of total Hgb — ABNORMAL HIGH (ref <5.7)
Mean Plasma Glucose: 131 mg/dL
eAG (mmol/L): 7.3 mmol/L

## 2020-09-12 LAB — EXTRA SPECIMEN

## 2020-09-20 ENCOUNTER — Encounter: Payer: Self-pay | Admitting: Internal Medicine

## 2020-09-20 NOTE — Progress Notes (Signed)
Nurse visit

## 2020-09-26 ENCOUNTER — Telehealth: Payer: Self-pay | Admitting: Orthopaedic Surgery

## 2020-09-26 NOTE — Telephone Encounter (Signed)
Patient's left total knee arthroplasty will be scheduled at [facility] with Dr. Roda Shutters on 11-13-20 at 7:15am.  Patient is asking for a refill on the 800 mg Ibuprofen.  Please call in at [facility]      Pt's cb  [phone]

## 2020-09-26 NOTE — Telephone Encounter (Signed)
Pt would like refill on ibuprofen

## 2020-09-27 ENCOUNTER — Other Ambulatory Visit: Payer: Self-pay | Admitting: Physician Assistant

## 2020-09-27 MED ORDER — IBUPROFEN 800 MG PO TABS: 800.0000 mg | ORAL_TABLET | Freq: Three times a day (TID) | ORAL | 0 refills | Status: DC | PRN

## 2020-09-27 NOTE — Telephone Encounter (Signed)
Patient aware.

## 2020-10-18 ENCOUNTER — Ambulatory Visit: Payer: Managed Care, Other (non HMO) | Admitting: Family Medicine

## 2020-10-20 ENCOUNTER — Other Ambulatory Visit: Payer: Self-pay | Admitting: Family Medicine

## 2020-11-06 ENCOUNTER — Encounter: Payer: Managed Care, Other (non HMO) | Admitting: Family Medicine

## 2020-11-07 ENCOUNTER — Other Ambulatory Visit: Payer: Self-pay | Admitting: Physician Assistant

## 2020-11-07 MED ORDER — DOCUSATE SODIUM 100 MG PO CAPS: 100.0000 mg | ORAL_CAPSULE | Freq: Every day | ORAL | 2 refills | Status: DC | PRN

## 2020-11-07 MED ORDER — ONDANSETRON HCL 4 MG PO TABS: 4.0000 mg | ORAL_TABLET | Freq: Three times a day (TID) | ORAL | 0 refills | Status: DC | PRN

## 2020-11-07 MED ORDER — ASPIRIN EC 81 MG PO TBEC: 81.0000 mg | DELAYED_RELEASE_TABLET | Freq: Two times a day (BID) | ORAL | 0 refills | Status: DC

## 2020-11-07 MED ORDER — OXYCODONE-ACETAMINOPHEN 5-325 MG PO TABS: 1.0000 | ORAL_TABLET | Freq: Four times a day (QID) | ORAL | 0 refills | Status: DC | PRN

## 2020-11-07 MED ORDER — METHOCARBAMOL 500 MG PO TABS: 500.0000 mg | ORAL_TABLET | Freq: Two times a day (BID) | ORAL | 0 refills | Status: DC | PRN

## 2020-11-07 MED ORDER — SULFAMETHOXAZOLE-TRIMETHOPRIM 800-160 MG PO TABS: 1.0000 | ORAL_TABLET | Freq: Two times a day (BID) | ORAL | 0 refills | Status: DC

## 2020-11-08 NOTE — Progress Notes (Signed)
Surgical Instructions    Your procedure is scheduled on Monday October 3.  Report to [facility] Main Entrance "A" at 5:30 A.M., then check in with the Admitting office.  Call this number if you have problems the morning of surgery:  [phone]   If you have any questions prior to your surgery date call [phone]: Open Monday-Friday 8am-4pm    Remember:  Do not eat after midnight the night before your surgery  You may drink clear liquids until 4:30am the morning of your surgery.   Clear liquids allowed are: Water, Non-Citrus Juices (without pulp), Carbonated Beverages, Clear Tea, Black Coffee ONLY (NO MILK, CREAM OR POWDERED CREAMER of any kind), and Gatorade  The day of surgery (if you have diabetes): Drink ONE (1) 12 oz G2 given to you in your pre admission testing appointment by 4:30am the morning of surgery. Drink in one sitting. Do not sip.  This drink was given to you during your hospital  pre-op appointment visit.  Nothing else to drink after completing the  12 oz bottle of G2.         If you have questions, please contact your surgeon's office.      Take these medicines the morning of surgery with A SIP OF WATER : atorvastatin (LIPITOR) cyclobenzaprine (FLEXERIL) if needed ondansetron (ZOFRAN) if needed valACYclovir (VALTREX) if needed  WHAT DO I DO ABOUT MY DIABETES MEDICATION?  Do not take oral diabetes medicines (pills) the morning of surgery.        DO NOT take metFORMIN (GLUCOPHAGE) the morning of surgery.    HOW TO MANAGE YOUR DIABETES BEFORE AND AFTER SURGERY  Why is it important to control my blood sugar before and after surgery? Improving blood sugar levels before and after surgery helps healing and can limit problems. A way of improving blood sugar control is eating a healthy diet by:  Eating less sugar and carbohydrates  Increasing activity/exercise  Talking with your doctor about reaching your blood sugar goals High blood sugars (greater than  180 mg/dL) can raise your risk of infections and slow your recovery, so you will need to focus on controlling your diabetes during the weeks before surgery. Make sure that the doctor who takes care of your diabetes knows about your planned surgery including the date and location.  How do I manage my blood sugar before surgery? Check your blood sugar at least 4 times a day, starting 2 days before surgery, to make sure that the level is not too high or low.  Check your blood sugar the morning of your surgery when you wake up and every 2 hours until you get to the Short Stay unit.  If your blood sugar is less than 70 mg/dL, you will need to treat for low blood sugar: Do not take insulin. Treat a low blood sugar (less than 70 mg/dL) with  cup of clear juice (cranberry or apple), 4 glucose tablets, OR glucose gel. Recheck blood sugar in 15 minutes after treatment (to make sure it is greater than 70 mg/dL). If your blood sugar is not greater than 70 mg/dL on recheck, call [phone] for further instructions. Report your blood sugar to the short stay nurse when you get to Short Stay.  If you are admitted to the hospital after surgery: Your blood sugar will be checked by the staff and you will probably be given insulin after surgery (instead of oral diabetes medicines) to make sure you have good blood sugar levels. The goal  for blood sugar control after surgery is 80-180 mg/dL.   As of today, STOP taking any Aspirin (unless otherwise instructed by your surgeon) Aleve, Naproxen, Ibuprofen, Motrin, Advil, Goody's, BC's, all herbal medications, fish oil, and all vitamins.           Do not wear jewelry or makeup Do not wear lotions, powders, perfumes/colognes, or deodorant. Do not shave 48 hours prior to surgery.  Men may shave face and neck. Do not bring valuables to the hospital. DO Not wear nail polish, gel polish, artificial nails, or any other type of covering on natural nails including finger  and toenails. If patients have artificial nails, gel coating, etc. that need to be removed by a nail salon please have this removed prior to surgery or surgery may need to be canceled/delayed if the surgeon/ anesthesia feels like the patient is unable to be adequately monitored.             [facility] is not responsible for any belongings or valuables.  Do NOT Smoke (Tobacco/Vaping)  24 hours prior to your procedure If you use a CPAP at night, you may bring your mask for your overnight stay.   Contacts, glasses, dentures or bridgework may not be worn into surgery, please bring cases for these belongings   For patients admitted to the hospital, discharge time will be determined by your treatment team.   Patients discharged the day of surgery will not be allowed to drive home, and someone needs to stay with them for 24 hours.  NO VISITORS WILL BE ALLOWED IN PRE-OP WHERE PATIENTS GET READY FOR SURGERY.  ONLY 1 SUPPORT PERSON MAY BE PRESENT IN THE WAITING ROOM WHILE YOU ARE IN SURGERY.  IF YOU ARE TO BE ADMITTED, ONCE YOU ARE IN YOUR ROOM YOU WILL BE ALLOWED TWO (2) VISITORS.  Minor children may have two parents present. Special consideration for safety and communication needs will be reviewed on a case by case basis.  Special instructions:    Oral Hygiene is also important to reduce your risk of infection.  Remember - BRUSH YOUR TEETH THE MORNING OF SURGERY WITH YOUR REGULAR TOOTHPASTE   [facility]- Preparing For Surgery  Before surgery, you can play an important role. Because skin is not sterile, your skin needs to be as free of germs as possible. You can reduce the number of germs on your skin by washing with CHG (chlorahexidine gluconate) Soap before surgery.  CHG is an antiseptic cleaner which kills germs and bonds with the skin to continue killing germs even after washing.     Please do not use if you have an allergy to CHG or antibacterial soaps. If your skin becomes reddened/irritated  stop using the CHG.  Do not shave (including legs and underarms) for at least 48 hours prior to first CHG shower. It is OK to shave your face.  Please follow these instructions carefully.     Shower the NIGHT BEFORE SURGERY and the MORNING OF SURGERY with CHG Soap.   If you chose to wash your hair, wash your hair first as usual with your normal shampoo. After you shampoo, rinse your hair and body thoroughly to remove the shampoo.  Then [employer] and genitals (private parts) with your normal soap and rinse thoroughly to remove soap.  After that Use CHG Soap as you would any other liquid soap. You can apply CHG directly to the skin and wash gently with a scrungie or a clean washcloth.  Apply the CHG Soap to your body ONLY FROM THE NECK DOWN.  Do not use on open wounds or open sores. Avoid contact with your eyes, ears, mouth and genitals (private parts). Wash Face and genitals (private parts)  with your normal soap.   Wash thoroughly, paying special attention to the area where your surgery will be performed.  Thoroughly rinse your body with warm water from the neck down.  DO NOT shower/wash with your normal soap after using and rinsing off the CHG Soap.  Pat yourself dry with a CLEAN TOWEL.  Wear CLEAN PAJAMAS to bed the night before surgery  Place CLEAN SHEETS on your bed the night before your surgery  DO NOT SLEEP WITH PETS.   Day of Surgery:  Take a shower with CHG soap. Wear Clean/Comfortable clothing the morning of surgery Do not apply any deodorants/lotions.   Remember to brush your teeth WITH YOUR REGULAR TOOTHPASTE.   Please read over the following fact sheets that you were given.

## 2020-11-09 ENCOUNTER — Other Ambulatory Visit: Payer: Self-pay

## 2020-11-09 ENCOUNTER — Encounter (HOSPITAL_COMMUNITY)
Admission: RE | Admit: 2020-11-09 | Discharge: 2020-11-09 | Disposition: A | Discharge status: Home / Self Care | Payer: No Typology Code available for payment source | Source: Ambulatory Visit | Attending: Orthopaedic Surgery | Admitting: Orthopaedic Surgery

## 2020-11-09 ENCOUNTER — Encounter (HOSPITAL_COMMUNITY): Payer: Self-pay

## 2020-11-09 DIAGNOSIS — Z79899 Other long term (current) drug therapy: Secondary | ICD-10-CM | POA: Diagnosis not present

## 2020-11-09 DIAGNOSIS — Z20822 Contact with and (suspected) exposure to covid-19: Secondary | ICD-10-CM | POA: Diagnosis not present

## 2020-11-09 DIAGNOSIS — Z01812 Encounter for preprocedural laboratory examination: Secondary | ICD-10-CM | POA: Insufficient documentation

## 2020-11-09 LAB — COMPREHENSIVE METABOLIC PANEL
ALT: 24 U/L (ref 0–44)
AST: 24 U/L (ref 15–41)
Albumin: 4.3 g/dL (ref 3.5–5.0)
Alkaline Phosphatase: 153 U/L — ABNORMAL HIGH (ref 38–126)
Anion gap: 11 (ref 5–15)
BUN: 9 mg/dL (ref 6–20)
CO2: 22 mmol/L (ref 22–32)
Calcium: 9.3 mg/dL (ref 8.9–10.3)
Chloride: 105 mmol/L (ref 98–111)
Creatinine, Ser: 1.07 mg/dL (ref 0.61–1.24)
GFR, Estimated: >60 mL/min (ref >60)
Glucose, Bld: 113 mg/dL — ABNORMAL HIGH (ref 70–99)
Potassium: 3.8 mmol/L (ref 3.5–5.1)
Sodium: 138 mmol/L (ref 135–145)
Total Bilirubin: 0.8 mg/dL (ref 0.3–1.2)
Total Protein: 7.6 g/dL (ref 6.5–8.1)

## 2020-11-09 LAB — CBC WITH DIFFERENTIAL/PLATELET
Abs Immature Granulocytes: 0 10*3/uL (ref 0.00–0.07)
Basophils Absolute: 0 10*3/uL (ref 0.0–0.1)
Basophils Relative: 1 %
Eosinophils Absolute: 0.2 10*3/uL (ref 0.0–0.5)
Eosinophils Relative: 4 %
HCT: 43 % (ref 39.0–52.0)
Hemoglobin: 13.6 g/dL (ref 13.0–17.0)
Immature Granulocytes: 0 %
Lymphocytes Relative: 63 %
Lymphs Abs: 2.7 10*3/uL (ref 0.7–4.0)
MCH: 28.5 pg (ref 26.0–34.0)
MCHC: 31.6 g/dL (ref 30.0–36.0)
MCV: 90 fL (ref 80.0–100.0)
Monocytes Absolute: 0.4 10*3/uL (ref 0.1–1.0)
Monocytes Relative: 9 %
Neutro Abs: 1 10*3/uL — ABNORMAL LOW (ref 1.7–7.7)
Neutrophils Relative %: 23 %
Platelets: 185 10*3/uL (ref 150–400)
RBC: 4.78 MIL/uL (ref 4.22–5.81)
RDW: 13.4 % (ref 11.5–15.5)
WBC: 4.2 10*3/uL (ref 4.0–10.5)
nRBC: 0 % (ref 0.0–0.2)

## 2020-11-09 LAB — URINALYSIS, ROUTINE W REFLEX MICROSCOPIC
Bilirubin Urine: NEGATIVE
Glucose, UA: NEGATIVE mg/dL
Hgb urine dipstick: NEGATIVE
Ketones, ur: NEGATIVE mg/dL
Leukocytes,Ua: NEGATIVE
Nitrite: NEGATIVE
Protein, ur: NEGATIVE mg/dL
Specific Gravity, Urine: 1.025 (ref 1.005–1.030)
pH: 5 (ref 5.0–8.0)

## 2020-11-09 LAB — SURGICAL PCR SCREEN
MRSA, PCR: NEGATIVE
Staphylococcus aureus: POSITIVE — AB

## 2020-11-09 LAB — PROTIME-INR
INR: 1 (ref 0.8–1.2)
Prothrombin Time: 12.7 seconds (ref 11.4–15.2)

## 2020-11-09 LAB — GLUCOSE, CAPILLARY: Glucose-Capillary: 107 mg/dL — ABNORMAL HIGH (ref 70–99)

## 2020-11-09 LAB — SARS CORONAVIRUS 2 (TAT 6-24 HRS): SARS Coronavirus 2: NEGATIVE

## 2020-11-09 LAB — APTT: aPTT: 30 seconds (ref 24–36)

## 2020-11-09 NOTE — Progress Notes (Signed)
PCP - Hetty Blend NP Cardiologist - none  PPM/ICD - denies Device Orders -  Rep Notified -   Chest x-ray - none EKG - 11/23/19 Stress Test - no ECHO - no Cardiac Cath - no  Sleep Study - no CPAP - no  Fasting Blood Sugar -  Checks Blood Sugar _____ times a day  Blood Thinner Instructions:n/a Aspirin Instructions:n/a  ERAS Protcol -yes-clear liquids until 0430 PRE-SURGERY Ensure or G2- G2  COVID TEST- yes 11/09/20   Anesthesia review: no  Patient denies shortness of breath, fever, cough and chest pain at PAT appointment   All instructions explained to the patient, with a verbal understanding of the material. Patient agrees to go over the instructions while at home for a better understanding. Patient also instructed to self quarantine after being tested for COVID-19. The opportunity to ask questions was provided.

## 2020-11-10 ENCOUNTER — Telehealth: Payer: Self-pay

## 2020-11-10 MED ORDER — TRANEXAMIC ACID 1000 MG/10ML IV SOLN
2000.0000 mg | INTRAVENOUS | Status: AC
  Administered 2020-11-13: 1000 mg via TOPICAL
  Filled 2020-11-10: qty 20

## 2020-11-10 NOTE — Telephone Encounter (Signed)
Patient is having surgery 11/13/2020- L TKA. He is W/C. Will need HHPT Approved. Hard copy on my desk.

## 2020-11-10 NOTE — Telephone Encounter (Signed)
Dorene Sorrow sent me an email.   Hi Marisue Ivan- I received a referral for a patient , surgery on 10/3 by Dr Roda Shutters, patient Randall Calderon, DOB [id number]. This patient is covered by [employer], we are not in network with worker's compn, the patient's worker's comp case manager will need to set up therapy services for this patient.   Just wanted to make sure you guys are aware   Thank you !  Dorene Sorrow

## 2020-11-12 NOTE — Anesthesia Preprocedure Evaluation (Addendum)
Anesthesia Evaluation  Patient identified by MRN, date of birth, ID band Patient awake    Reviewed: Allergy & Precautions, NPO status , Patient's Chart, lab work & pertinent test results  History of Anesthesia Complications Negative for: history of anesthetic complications  Airway Mallampati: II  TM Distance: >3 FB Neck ROM: Full    Dental  (+) Dental Advisory Given, Missing,    Pulmonary former smoker,    Pulmonary exam normal        Cardiovascular hypertension, Pt. on medications Normal cardiovascular exam     Neuro/Psych negative neurological ROS  negative psych ROS   GI/Hepatic negative GI ROS, Neg liver ROS,   Endo/Other  diabetes, Type 2, Oral Hypoglycemic Agents  Renal/GU negative Renal ROS     Musculoskeletal  (+) Arthritis ,   Abdominal   Peds  Hematology negative hematology ROS (+)  Plt 185k    Anesthesia Other Findings HSV  Reproductive/Obstetrics                            Anesthesia Physical Anesthesia Plan  ASA: 2  Anesthesia Plan: Spinal   Post-op Pain Management:  Regional for Post-op pain   Induction:   PONV Risk Score and Plan: 1 and Treatment may vary due to age or medical condition and Propofol infusion  Airway Management Planned: Natural Airway and Simple Face Mask  Additional Equipment: None  Intra-op Plan:   Post-operative Plan:   Informed Consent: I have reviewed the patients History and Physical, chart, labs and discussed the procedure including the risks, benefits and alternatives for the proposed anesthesia with the patient or authorized representative who has indicated his/her understanding and acceptance.       Plan Discussed with: CRNA and Anesthesiologist  Anesthesia Plan Comments: (Labs reviewed, platelets acceptable. Discussed risks and benefits of spinal, including spinal/epidural hematoma, infection, failed block, and PDPH. Patient  expressed understanding and wished to proceed. )       Anesthesia Quick Evaluation

## 2020-11-13 ENCOUNTER — Encounter (HOSPITAL_COMMUNITY): Payer: Self-pay | Admitting: Orthopaedic Surgery

## 2020-11-13 ENCOUNTER — Ambulatory Visit (HOSPITAL_COMMUNITY): Payer: No Typology Code available for payment source | Admitting: Certified Registered"

## 2020-11-13 ENCOUNTER — Other Ambulatory Visit: Payer: Self-pay

## 2020-11-13 ENCOUNTER — Observation Stay (HOSPITAL_COMMUNITY)
Admission: RE | Admit: 2020-11-13 | Discharge: 2020-11-14 | Disposition: A | Discharge status: Home / Self Care | Payer: No Typology Code available for payment source | Source: Ambulatory Visit | Attending: Orthopaedic Surgery | Admitting: Orthopaedic Surgery

## 2020-11-13 ENCOUNTER — Observation Stay (HOSPITAL_COMMUNITY): Payer: No Typology Code available for payment source

## 2020-11-13 ENCOUNTER — Encounter (HOSPITAL_COMMUNITY)
Admission: RE | Disposition: A | Discharge status: Home / Self Care | Payer: Self-pay | Source: Ambulatory Visit | Attending: Orthopaedic Surgery

## 2020-11-13 DIAGNOSIS — Z96652 Presence of left artificial knee joint: Secondary | ICD-10-CM

## 2020-11-13 DIAGNOSIS — I10 Essential (primary) hypertension: Secondary | ICD-10-CM | POA: Insufficient documentation

## 2020-11-13 DIAGNOSIS — E119 Type 2 diabetes mellitus without complications: Secondary | ICD-10-CM | POA: Insufficient documentation

## 2020-11-13 DIAGNOSIS — Z7982 Long term (current) use of aspirin: Secondary | ICD-10-CM | POA: Diagnosis not present

## 2020-11-13 DIAGNOSIS — S83242A Other tear of medial meniscus, current injury, left knee, initial encounter: Secondary | ICD-10-CM

## 2020-11-13 DIAGNOSIS — Z87891 Personal history of nicotine dependence: Secondary | ICD-10-CM | POA: Diagnosis not present

## 2020-11-13 DIAGNOSIS — Z79899 Other long term (current) drug therapy: Secondary | ICD-10-CM | POA: Insufficient documentation

## 2020-11-13 DIAGNOSIS — M1712 Unilateral primary osteoarthritis, left knee: Principal | ICD-10-CM | POA: Insufficient documentation

## 2020-11-13 DIAGNOSIS — R52 Pain, unspecified: Secondary | ICD-10-CM

## 2020-11-13 HISTORY — PX: TOTAL KNEE ARTHROPLASTY: SHX125

## 2020-11-13 LAB — GLUCOSE, CAPILLARY
Glucose-Capillary: 103 mg/dL — ABNORMAL HIGH (ref 70–99)
Glucose-Capillary: 126 mg/dL — ABNORMAL HIGH (ref 70–99)
Glucose-Capillary: 85 mg/dL (ref 70–99)
Glucose-Capillary: 110 mg/dL — ABNORMAL HIGH (ref 70–99)
Glucose-Capillary: 194 mg/dL — ABNORMAL HIGH (ref 70–99)

## 2020-11-13 SURGERY — ARTHROPLASTY, KNEE, TOTAL
Anesthesia: Spinal | Site: Knee | Laterality: Left

## 2020-11-13 MED ORDER — OXYCODONE HCL 5 MG PO TABS
10.0000 mg | ORAL_TABLET | ORAL | Status: DC | PRN
  Administered 2020-11-14: 10 mg via ORAL
  Filled 2020-11-13: qty 2

## 2020-11-13 MED ORDER — LACTATED RINGERS IV SOLN: INTRAVENOUS | Status: DC

## 2020-11-13 MED ORDER — METHOCARBAMOL 1000 MG/10ML IJ SOLN
500.0000 mg | Freq: Four times a day (QID) | INTRAVENOUS | Status: DC | PRN
  Filled 2020-11-13: qty 5

## 2020-11-13 MED ORDER — GLYCOPYRROLATE PF 0.2 MG/ML IJ SOSY
PREFILLED_SYRINGE | INTRAMUSCULAR | Status: AC
  Filled 2020-11-13: qty 1

## 2020-11-13 MED ORDER — HYDROMORPHONE HCL 1 MG/ML IJ SOLN
0.5000 mg | INTRAMUSCULAR | Status: DC | PRN
  Administered 2020-11-13 (×2): 1 mg via INTRAVENOUS
  Filled 2020-11-13 (×2): qty 1

## 2020-11-13 MED ORDER — IRRISEPT - 450ML BOTTLE WITH 0.05% CHG IN STERILE WATER, USP 99.95% OPTIME
TOPICAL | Status: DC | PRN
  Administered 2020-11-13: 450 mL via TOPICAL

## 2020-11-13 MED ORDER — MIDAZOLAM HCL 2 MG/2ML IJ SOLN
INTRAMUSCULAR | Status: AC
  Filled 2020-11-13: qty 2

## 2020-11-13 MED ORDER — CEFAZOLIN SODIUM-DEXTROSE 2-4 GM/100ML-% IV SOLN
2.0000 g | Freq: Four times a day (QID) | INTRAVENOUS | Status: AC
Start: 2020-11-13 — End: 2020-11-13
  Administered 2020-11-13 (×2): 2 g via INTRAVENOUS
  Filled 2020-11-13 (×2): qty 100

## 2020-11-13 MED ORDER — METFORMIN HCL 500 MG PO TABS
500.0000 mg | ORAL_TABLET | Freq: Every day | ORAL | Status: DC
  Administered 2020-11-14: 500 mg via ORAL
  Filled 2020-11-13: qty 1

## 2020-11-13 MED ORDER — LACTATED RINGERS IV SOLN: INTRAVENOUS | Status: DC | PRN

## 2020-11-13 MED ORDER — HYDROXYZINE HCL 50 MG/ML IM SOLN
50.0000 mg | Freq: Four times a day (QID) | INTRAMUSCULAR | Status: DC | PRN
  Administered 2020-11-13: 50 mg via INTRAMUSCULAR
  Filled 2020-11-13: qty 1

## 2020-11-13 MED ORDER — INSULIN ASPART 100 UNIT/ML IJ SOLN
0.0000 [IU] | Freq: Three times a day (TID) | INTRAMUSCULAR | Status: DC
  Administered 2020-11-14: 2 [IU] via SUBCUTANEOUS

## 2020-11-13 MED ORDER — METHOCARBAMOL 500 MG PO TABS
500.0000 mg | ORAL_TABLET | Freq: Four times a day (QID) | ORAL | Status: DC | PRN
  Administered 2020-11-13 – 2020-11-14 (×3): 500 mg via ORAL
  Filled 2020-11-13 (×3): qty 1

## 2020-11-13 MED ORDER — 0.9 % SODIUM CHLORIDE (POUR BTL) OPTIME
TOPICAL | Status: DC | PRN
  Administered 2020-11-13: 1000 mL

## 2020-11-13 MED ORDER — PROPOFOL 10 MG/ML IV BOLUS
INTRAVENOUS | Status: DC | PRN
  Administered 2020-11-13: 50 mg via INTRAVENOUS

## 2020-11-13 MED ORDER — VANCOMYCIN HCL 1000 MG IV SOLR
INTRAVENOUS | Status: AC
  Filled 2020-11-13: qty 20

## 2020-11-13 MED ORDER — ONDANSETRON HCL 4 MG/2ML IJ SOLN
INTRAMUSCULAR | Status: DC | PRN
  Administered 2020-11-13: 4 mg via INTRAVENOUS

## 2020-11-13 MED ORDER — ASPIRIN 81 MG PO CHEW
81.0000 mg | CHEWABLE_TABLET | Freq: Two times a day (BID) | ORAL | Status: DC
  Administered 2020-11-13 – 2020-11-14 (×2): 81 mg via ORAL
  Filled 2020-11-13 (×2): qty 1

## 2020-11-13 MED ORDER — TRANEXAMIC ACID-NACL 1000-0.7 MG/100ML-% IV SOLN
1000.0000 mg | Freq: Once | INTRAVENOUS | Status: AC
  Administered 2020-11-13: 1000 mg via INTRAVENOUS
  Filled 2020-11-13: qty 100

## 2020-11-13 MED ORDER — MIDAZOLAM HCL 5 MG/5ML IJ SOLN
INTRAMUSCULAR | Status: DC | PRN
  Administered 2020-11-13: 2 mg via INTRAVENOUS

## 2020-11-13 MED ORDER — SODIUM CHLORIDE 0.9 % IV SOLN: INTRAVENOUS | Status: DC

## 2020-11-13 MED ORDER — ONDANSETRON HCL 4 MG/2ML IJ SOLN
INTRAMUSCULAR | Status: AC
  Filled 2020-11-13: qty 2

## 2020-11-13 MED ORDER — ORAL CARE MOUTH RINSE: 15.0000 mL | Freq: Once | OROMUCOSAL | Status: AC

## 2020-11-13 MED ORDER — BUPIVACAINE-EPINEPHRINE (PF) 0.5% -1:200000 IJ SOLN
INTRAMUSCULAR | Status: DC | PRN
  Administered 2020-11-13: 25 mL via PERINEURAL

## 2020-11-13 MED ORDER — DOCUSATE SODIUM 100 MG PO CAPS
100.0000 mg | ORAL_CAPSULE | Freq: Two times a day (BID) | ORAL | Status: DC
  Administered 2020-11-13 – 2020-11-14 (×2): 100 mg via ORAL
  Filled 2020-11-13 (×2): qty 1

## 2020-11-13 MED ORDER — ONDANSETRON HCL 4 MG/2ML IJ SOLN
4.0000 mg | Freq: Four times a day (QID) | INTRAMUSCULAR | Status: DC | PRN
  Administered 2020-11-14: 4 mg via INTRAVENOUS
  Filled 2020-11-13 (×2): qty 2

## 2020-11-13 MED ORDER — MENTHOL 3 MG MT LOZG: 1.0000 | LOZENGE | OROMUCOSAL | Status: DC | PRN

## 2020-11-13 MED ORDER — ACETAMINOPHEN 325 MG PO TABS: 325.0000 mg | ORAL_TABLET | Freq: Four times a day (QID) | ORAL | Status: DC | PRN

## 2020-11-13 MED ORDER — ONDANSETRON HCL 4 MG PO TABS: 4.0000 mg | ORAL_TABLET | Freq: Four times a day (QID) | ORAL | Status: DC | PRN

## 2020-11-13 MED ORDER — TRANEXAMIC ACID 1000 MG/10ML IV SOLN
INTRAVENOUS | Status: DC | PRN
  Administered 2020-11-13: 2000 mg via TOPICAL

## 2020-11-13 MED ORDER — BUPIVACAINE IN DEXTROSE 0.75-8.25 % IT SOLN
INTRATHECAL | Status: DC | PRN
  Administered 2020-11-13: 1.6 mL via INTRATHECAL

## 2020-11-13 MED ORDER — TRANEXAMIC ACID-NACL 1000-0.7 MG/100ML-% IV SOLN
1000.0000 mg | INTRAVENOUS | Status: DC
  Filled 2020-11-13: qty 100

## 2020-11-13 MED ORDER — PROPOFOL 500 MG/50ML IV EMUL
INTRAVENOUS | Status: DC | PRN
  Administered 2020-11-13: 100 ug/kg/min via INTRAVENOUS

## 2020-11-13 MED ORDER — OXYCODONE HCL 5 MG PO TABS: 5.0000 mg | ORAL_TABLET | Freq: Once | ORAL | Status: DC | PRN

## 2020-11-13 MED ORDER — CEFAZOLIN SODIUM-DEXTROSE 2-4 GM/100ML-% IV SOLN
2.0000 g | INTRAVENOUS | Status: AC
  Administered 2020-11-13: 2 g via INTRAVENOUS
  Filled 2020-11-13: qty 100

## 2020-11-13 MED ORDER — FENTANYL CITRATE (PF) 100 MCG/2ML IJ SOLN
INTRAMUSCULAR | Status: AC
  Filled 2020-11-13: qty 2

## 2020-11-13 MED ORDER — BUPIVACAINE-MELOXICAM ER 400-12 MG/14ML IJ SOLN
INTRAMUSCULAR | Status: AC
  Filled 2020-11-13: qty 1

## 2020-11-13 MED ORDER — VANCOMYCIN HCL 1000 MG IV SOLR
INTRAVENOUS | Status: DC | PRN
  Administered 2020-11-13: 1000 mg via TOPICAL

## 2020-11-13 MED ORDER — PROPOFOL 1000 MG/100ML IV EMUL
INTRAVENOUS | Status: AC
  Filled 2020-11-13: qty 100

## 2020-11-13 MED ORDER — PROMETHAZINE HCL 25 MG/ML IJ SOLN: 6.2500 mg | INTRAMUSCULAR | Status: DC | PRN

## 2020-11-13 MED ORDER — PHENOL 1.4 % MT LIQD: 1.0000 | OROMUCOSAL | Status: DC | PRN

## 2020-11-13 MED ORDER — POVIDONE-IODINE 10 % EX SWAB
2.0000 "application " | Freq: Once | CUTANEOUS | Status: AC
  Administered 2020-11-13: 2 via TOPICAL

## 2020-11-13 MED ORDER — OXYCODONE HCL 5 MG/5ML PO SOLN
5.0000 mg | Freq: Once | ORAL | Status: DC | PRN
Start: 2020-11-13 — End: 2020-11-13

## 2020-11-13 MED ORDER — FENTANYL CITRATE (PF) 100 MCG/2ML IJ SOLN: 25.0000 ug | INTRAMUSCULAR | Status: DC | PRN

## 2020-11-13 MED ORDER — SODIUM CHLORIDE 0.9 % IR SOLN
Status: DC | PRN
  Administered 2020-11-13: 3000 mL

## 2020-11-13 MED ORDER — METOCLOPRAMIDE HCL 5 MG/ML IJ SOLN: 5.0000 mg | Freq: Three times a day (TID) | INTRAMUSCULAR | Status: DC | PRN

## 2020-11-13 MED ORDER — SODIUM CHLORIDE 0.9 % IV SOLN
INTRAVENOUS | Status: DC | PRN
  Administered 2020-11-13: 30 ug/min via INTRAVENOUS

## 2020-11-13 MED ORDER — CHLORHEXIDINE GLUCONATE 0.12 % MT SOLN
15.0000 mL | Freq: Once | OROMUCOSAL | Status: AC
  Administered 2020-11-13: 15 mL via OROMUCOSAL
  Filled 2020-11-13: qty 15

## 2020-11-13 MED ORDER — FENTANYL CITRATE (PF) 100 MCG/2ML IJ SOLN
INTRAMUSCULAR | Status: DC | PRN
  Administered 2020-11-13: 100 ug via INTRAVENOUS

## 2020-11-13 MED ORDER — OXYCODONE HCL 5 MG PO TABS
5.0000 mg | ORAL_TABLET | ORAL | Status: DC | PRN
  Administered 2020-11-13 – 2020-11-14 (×3): 10 mg via ORAL
  Filled 2020-11-13 (×4): qty 2

## 2020-11-13 MED ORDER — DEXAMETHASONE SODIUM PHOSPHATE 10 MG/ML IJ SOLN
10.0000 mg | Freq: Once | INTRAMUSCULAR | Status: AC
  Administered 2020-11-14: 10 mg via INTRAVENOUS
  Filled 2020-11-13: qty 1

## 2020-11-13 MED ORDER — ACETAMINOPHEN 500 MG PO TABS
1000.0000 mg | ORAL_TABLET | Freq: Four times a day (QID) | ORAL | Status: AC
  Administered 2020-11-13 – 2020-11-14 (×4): 1000 mg via ORAL
  Filled 2020-11-13 (×4): qty 2

## 2020-11-13 MED ORDER — PROPOFOL 10 MG/ML IV BOLUS
INTRAVENOUS | Status: AC
  Filled 2020-11-13: qty 20

## 2020-11-13 MED ORDER — METOCLOPRAMIDE HCL 5 MG PO TABS
5.0000 mg | ORAL_TABLET | Freq: Three times a day (TID) | ORAL | Status: DC | PRN
Start: 2020-11-13 — End: 2020-11-14

## 2020-11-13 SURGICAL SUPPLY — 80 items
ALCOHOL 70% 16 OZ (MISCELLANEOUS) ×2 IMPLANT
BAG COUNTER SPONGE SURGICOUNT (BAG) IMPLANT
BAG DECANTER FOR FLEXI CONT (MISCELLANEOUS) ×2 IMPLANT
BANDAGE ESMARK 6X9 LF (GAUZE/BANDAGES/DRESSINGS) IMPLANT
BLADE SAG 18X100X1.27 (BLADE) ×4 IMPLANT
BNDG ESMARK 6X9 LF (GAUZE/BANDAGES/DRESSINGS)
BOWL SMART MIX CTS (DISPOSABLE) ×2 IMPLANT
CEMENT BONE R 1X40 (Cement) ×4 IMPLANT
CEMENT BONE SIMPLEX SPEEDSET (Cement) IMPLANT
CLSR STERI-STRIP ANTIMIC 1/2X4 (GAUZE/BANDAGES/DRESSINGS) ×4 IMPLANT
COOLER ICEMAN CLASSIC (MISCELLANEOUS) ×2 IMPLANT
COVER SURGICAL LIGHT HANDLE (MISCELLANEOUS) ×2 IMPLANT
CUFF TOURN SGL QUICK 34 (TOURNIQUET CUFF) ×1
CUFF TOURN SGL QUICK 42 (TOURNIQUET CUFF) IMPLANT
CUFF TRNQT CYL 34X4.125X (TOURNIQUET CUFF) ×1 IMPLANT
DERMABOND ADHESIVE PROPEN (GAUZE/BANDAGES/DRESSINGS) ×1
DERMABOND ADVANCED (GAUZE/BANDAGES/DRESSINGS) ×1
DERMABOND ADVANCED .7 DNX12 (GAUZE/BANDAGES/DRESSINGS) ×1 IMPLANT
DERMABOND ADVANCED .7 DNX6 (GAUZE/BANDAGES/DRESSINGS) ×1 IMPLANT
DRAPE EXTREMITY T 121X128X90 (DISPOSABLE) ×2 IMPLANT
DRAPE HALF SHEET 40X57 (DRAPES) ×2 IMPLANT
DRAPE INCISE IOBAN 66X45 STRL (DRAPES) IMPLANT
DRAPE ORTHO SPLIT 77X108 STRL (DRAPES) ×2
DRAPE POUCH INSTRU U-SHP 10X18 (DRAPES) ×2 IMPLANT
DRAPE SURG ORHT 6 SPLT 77X108 (DRAPES) ×2 IMPLANT
DRAPE U-SHAPE 47X51 STRL (DRAPES) ×4 IMPLANT
DRSG AQUACEL AG ADV 3.5X10 (GAUZE/BANDAGES/DRESSINGS) ×2 IMPLANT
DRSG AQUACEL AG ADV 3.5X14 (GAUZE/BANDAGES/DRESSINGS) ×2 IMPLANT
DURAPREP 26ML APPLICATOR (WOUND CARE) ×6 IMPLANT
ELECT CAUTERY BLADE 6.4 (BLADE) ×2 IMPLANT
ELECT REM PT RETURN 9FT ADLT (ELECTROSURGICAL) ×2
ELECTRODE REM PT RTRN 9FT ADLT (ELECTROSURGICAL) ×1 IMPLANT
FEMORAL KNEE COMP SZ 8 STND LT (Knees) ×2 IMPLANT
FEMORAL KNEE COMP SZ 8STD LT (Knees) ×1 IMPLANT
GLOVE SURG NEOP MICRO LF SZ7.5 (GLOVE) ×6 IMPLANT
GLOVE SURG SYN 7.5  E (GLOVE) ×4
GLOVE SURG SYN 7.5 E (GLOVE) ×4 IMPLANT
GLOVE SURG UNDER LTX SZ7.5 (GLOVE) ×6 IMPLANT
GLOVE SURG UNDER POLY LF SZ7 (GLOVE) ×10 IMPLANT
GOWN STRL REIN XL XLG (GOWN DISPOSABLE) ×2 IMPLANT
GOWN STRL REUS W/ TWL LRG LVL3 (GOWN DISPOSABLE) ×1 IMPLANT
GOWN STRL REUS W/TWL LRG LVL3 (GOWN DISPOSABLE) ×1
HANDPIECE INTERPULSE COAX TIP (DISPOSABLE) ×1
HDLS TROCR DRIL PIN KNEE 75 (PIN) ×4
HOOD PEEL AWAY FLYTE STAYCOOL (MISCELLANEOUS) ×4 IMPLANT
JET LAVAGE IRRISEPT WOUND (IRRIGATION / IRRIGATOR) ×2
KIT BASIN OR (CUSTOM PROCEDURE TRAY) ×2 IMPLANT
KIT TURNOVER KIT B (KITS) ×2 IMPLANT
LAVAGE JET IRRISEPT WOUND (IRRIGATION / IRRIGATOR) ×1 IMPLANT
MANIFOLD NEPTUNE II (INSTRUMENTS) ×2 IMPLANT
MARKER SKIN DUAL TIP RULER LAB (MISCELLANEOUS) ×2 IMPLANT
NEEDLE SPNL 18GX3.5 QUINCKE PK (NEEDLE) ×2 IMPLANT
NS IRRIG 1000ML POUR BTL (IV SOLUTION) ×2 IMPLANT
PACK TOTAL JOINT (CUSTOM PROCEDURE TRAY) ×2 IMPLANT
PAD ARMBOARD 7.5X6 YLW CONV (MISCELLANEOUS) ×4 IMPLANT
PAD COLD SHLDR WRAP-ON (PAD) ×2 IMPLANT
PIN DRILL HDLS TROCAR 75 4PK (PIN) ×4 IMPLANT
SAW OSC TIP CART 19.5X105X1.3 (SAW) ×2 IMPLANT
SCREW FEMALE HEX FIX 25X2.5 (ORTHOPEDIC DISPOSABLE SUPPLIES) ×2 IMPLANT
SET HNDPC FAN SPRY TIP SCT (DISPOSABLE) ×1 IMPLANT
STAPLER VISISTAT 35W (STAPLE) IMPLANT
STEM ARTISURF EF 12 SZ8-11 (Stem) ×2 IMPLANT
STEM POLY PAT PLY 35M KNEE (Knees) ×2 IMPLANT
STEM TIBIA 5 DEG SZ E L KNEE (Knees) ×1 IMPLANT
SUCTION FRAZIER HANDLE 10FR (MISCELLANEOUS) ×1
SUCTION TUBE FRAZIER 10FR DISP (MISCELLANEOUS) ×1 IMPLANT
SUT ETHILON 2 0 FS 18 (SUTURE) IMPLANT
SUT MNCRL AB 4-0 PS2 18 (SUTURE) IMPLANT
SUT VIC AB 0 CT1 27 (SUTURE) ×2
SUT VIC AB 0 CT1 27XBRD ANBCTR (SUTURE) ×2 IMPLANT
SUT VIC AB 1 CTX 27 (SUTURE) ×6 IMPLANT
SUT VIC AB 2-0 CT1 27 (SUTURE) ×4
SUT VIC AB 2-0 CT1 TAPERPNT 27 (SUTURE) ×4 IMPLANT
SYR 50ML LL SCALE MARK (SYRINGE) ×4 IMPLANT
TIBIA STEM 5 DEG SZ E L KNEE (Knees) ×2 IMPLANT
TOWEL GREEN STERILE (TOWEL DISPOSABLE) ×2 IMPLANT
TOWEL GREEN STERILE FF (TOWEL DISPOSABLE) ×2 IMPLANT
TRAY CATH 16FR W/PLASTIC CATH (SET/KITS/TRAYS/PACK) IMPLANT
UNDERPAD 30X36 HEAVY ABSORB (UNDERPADS AND DIAPERS) ×2 IMPLANT
YANKAUER SUCT BULB TIP NO VENT (SUCTIONS) ×2 IMPLANT

## 2020-11-13 NOTE — Evaluation (Signed)
Physical Therapy Evaluation Patient Details Name: Randall Calderon MRN: [id number] DOB: 1965/02/14 Today's Date: 11/13/2020  History of Present Illness  Pt is a 55 y/o male s/p L TKA on 10/3. PMH includes DM and HTN.  Clinical Impression  Pt admitted secondary to problem above with deficits below. Pt requiring min A for bed mobility. Pt with 10/10 pain and increased nausea in sitting so further mobility deferred. Anticipate pt will progress well once symptoms improve. Reviewed knee precautions with pt. Will continue to follow acutely.        Recommendations for follow up therapy are one component of a multi-disciplinary discharge planning process, led by the attending physician.  Recommendations may be updated based on patient status, additional functional criteria and insurance authorization.  Follow Up Recommendations Follow surgeon's recommendation for DC plan and follow-up therapies    Equipment Recommendations  Rolling walker with 5" wheels;3in1 (PT)    Recommendations for Other Services       Precautions / Restrictions Precautions Precautions: Knee Precaution Booklet Issued: No Precaution Comments: Verbally reviewed knee precautions with pt. Restrictions Weight Bearing Restrictions: Yes LLE Weight Bearing: Weight bearing as tolerated      Mobility  Bed Mobility Overal bed mobility: Needs Assistance Bed Mobility: Supine to Sit;Sit to Supine     Supine to sit: Min assist Sit to supine: Min assist   General bed mobility comments: Required assist with LLE management. Pt with increased nausea and pain in sitting so further mobility deferred.    Transfers                    Ambulation/Gait                Stairs            Wheelchair Mobility    Modified Rankin (Stroke Patients Only)       Balance Overall balance assessment: Needs assistance Sitting-balance support: No upper extremity supported;Feet supported Sitting balance-Leahy Scale:  Fair                                       Pertinent Vitals/Pain Pain Assessment: 0-10 Pain Score: 10-Worst pain ever Pain Location: L knee Pain Descriptors / Indicators: Grimacing;Guarding Pain Intervention(s): Limited activity within patient's tolerance;Monitored during session;Repositioned    Home Living Family/patient expects to be discharged to:: Private residence Living Arrangements: Spouse/significant other Available Help at Discharge: Family;Available PRN/intermittently Type of Home: House Home Access: Stairs to enter Entrance Stairs-Rails: None Entrance Stairs-Number of Steps: 1 (big enough to fit walker) Home Layout: One level Home Equipment: None      Prior Function Level of Independence: Independent               Hand Dominance        Extremity/Trunk Assessment   Upper Extremity Assessment Upper Extremity Assessment: Defer to OT evaluation    Lower Extremity Assessment Lower Extremity Assessment: LLE deficits/detail LLE Deficits / Details: Deficits consistent with post op pain and weakness.    Cervical / Trunk Assessment Cervical / Trunk Assessment: Normal  Communication   Communication: No difficulties  Cognition Arousal/Alertness: Awake/alert Behavior During Therapy: WFL for tasks assessed/performed Overall Cognitive Status: Within Functional Limits for tasks assessed  General Comments      Exercises Total Joint Exercises Ankle Circles/Pumps: AROM;Both;5 reps   Assessment/Plan    PT Assessment Patient needs continued PT services  PT Problem List Decreased strength;Decreased activity tolerance;Decreased balance;Decreased mobility;Decreased knowledge of use of DME;Decreased knowledge of precautions;Pain       PT Treatment Interventions DME instruction;Gait training;Stair training;Functional mobility training;Therapeutic activities;Therapeutic exercise;Balance  training;Patient/family education    PT Goals (Current goals can be found in the Care Plan section)  Acute Rehab PT Goals Patient Stated Goal: to decreased pain PT Goal Formulation: With patient Time For Goal Achievement: 11/27/20 Potential to Achieve Goals: Good    Frequency 7X/week   Barriers to discharge        Co-evaluation               AM-PAC PT "6 Clicks" Mobility  Outcome Measure Help needed turning from your back to your side while in a flat bed without using bedrails?: A Little Help needed moving from lying on your back to sitting on the side of a flat bed without using bedrails?: A Little Help needed moving to and from a bed to a chair (including a wheelchair)?: A Lot Help needed standing up from a chair using your arms (e.g., wheelchair or bedside chair)?: A Lot Help needed to walk in hospital room?: A Lot Help needed climbing 3-5 steps with a railing? : A Lot 6 Click Score: 14    End of Session   Activity Tolerance: Patient limited by pain Patient left: in bed;with call bell/phone within reach;with family/visitor present Nurse Communication: Mobility status PT Visit Diagnosis: Unsteadiness on feet (R26.81);Muscle weakness (generalized) (M62.81);Difficulty in walking, not elsewhere classified (R26.2);Pain Pain - Right/Left: Left Pain - part of body: Knee    Time: 4332-9518 PT Time Calculation (min) (ACUTE ONLY): 16 min   Charges:   PT Evaluation $PT Eval Low Complexity: 1 Low          Cindee Salt, DPT  Acute Rehabilitation Services  Pager: [phone] Office: [phone]   Lehman Prom 11/13/2020, 4:40 PM

## 2020-11-13 NOTE — Anesthesia Procedure Notes (Addendum)
Anesthesia Regional Block: Adductor canal block   Pre-Anesthetic Checklist: , timeout performed,  Correct Patient, Correct Site, Correct Laterality,  Correct Procedure, Correct Position, site marked,  Risks and benefits discussed,  Surgical consent,  Pre-op evaluation,  At surgeon's request and post-op pain management  Laterality: Left  Prep: chloraprep       Needles:  Injection technique: Single-shot  Needle Type: Echogenic Needle     Needle Length: 10cm  Needle Gauge: 21     Additional Needles:   Narrative:  Start time: 11/13/2020 7:06 AM End time: 11/13/2020 7:09 AM Injection made incrementally with aspirations every 5 mL.  Performed by: Personally  Anesthesiologist: Beryle Lathe, MD  Additional Notes: No pain on injection. No increased resistance to injection. Injection made in 5cc increments. Good needle visualization. Patient tolerated the procedure well.

## 2020-11-13 NOTE — Discharge Instructions (Signed)

## 2020-11-13 NOTE — Anesthesia Postprocedure Evaluation (Signed)
Anesthesia Post Note  Patient: Randall Calderon  Procedure(s) Performed: LEFT TOTAL KNEE ARTHROPLASTY (Left: Knee)     Patient location during evaluation: PACU Anesthesia Type: Spinal Level of consciousness: awake and alert Pain management: pain level controlled Vital Signs Assessment: post-procedure vital signs reviewed and stable Respiratory status: spontaneous breathing and respiratory function stable Cardiovascular status: blood pressure returned to baseline and stable Postop Assessment: spinal receding and no apparent nausea or vomiting Anesthetic complications: no   No notable events documented.  Last Vitals:  Vitals:   11/13/20 1010 11/13/20 1040  BP: 115/89 122/90  Pulse: 69 68  Resp: 14 18  Temp: (!) 36.4 C   SpO2: 99% 99%                  Beryle Lathe

## 2020-11-13 NOTE — Transfer of Care (Signed)
Immediate Anesthesia Transfer of Care Note  Patient: Jordell Neenan  Procedure(s) Performed: LEFT TOTAL KNEE ARTHROPLASTY (Left: Knee)  Patient Location: PACU  Anesthesia Type:MAC, Regional and Spinal  Level of Consciousness: awake, alert , oriented and patient cooperative  Airway & Oxygen Therapy: Patient Spontanous Breathing and Patient connected to nasal cannula oxygen  Post-op Assessment: Report given to RN, Post -op Vital signs reviewed and stable and Patient moving all extremities X 4  Post vital signs: Reviewed and stable  Last Vitals:  Vitals Value Taken Time  BP 111/75 11/13/20 0938  Temp    Pulse 82 11/13/20 0940  Resp 16 11/13/20 0940  SpO2 100 % 11/13/20 0940  Vitals shown include unvalidated device data.  Last Pain:  Vitals:   11/13/20 0554  TempSrc: Oral  PainSc: 5       Patients Stated Pain Goal: 2 (56/25/63 8937)  Complications: No notable events documented.

## 2020-11-13 NOTE — Op Note (Signed)
Total Knee Arthroplasty Procedure Note  Preoperative diagnosis: Left knee osteoarthritis  Postoperative diagnosis:same  Operative procedure: Left total knee arthroplasty. CPT [phone]  Surgeon: N. Glee Arvin, MD  Assist: Oneal Grout, PA-C; necessary for the timely completion of procedure and due to complexity of procedure.  Anesthesia: Spinal, regional, local  Tourniquet time: see anesthesia record  Implants used: Zimmer persona Femur: CR 8 Tibia: E Patella: 35 mm Polyethylene: 12 mm, MC  Indication: Randall Calderon is a 55 y.o. year old male with a history of knee pain. Having failed conservative management, the patient elected to proceed with a total knee arthroplasty.  We have reviewed the risk and benefits of the surgery and they elected to proceed after voicing understanding.  Procedure:  After informed consent was obtained and understanding of the risk were voiced including but not limited to bleeding, infection, damage to surrounding structures including nerves and vessels, blood clots, leg length inequality and the failure to achieve desired results, the operative extremity was marked with verbal confirmation of the patient in the holding area.   The patient was then brought to the operating room and transported to the operating room table in the supine position.  A tourniquet was applied to the operative extremity around the upper thigh. The operative limb was then prepped and draped in the usual sterile fashion and preoperative antibiotics were administered.  A time out was performed prior to the start of surgery confirming the correct extremity, preoperative antibiotic administration, as well as team members, implants and instruments available for the case. Correct surgical site was also confirmed with preoperative radiographs. The limb was then elevated for exsanguination and the tourniquet was inflated. A midline incision was made and a standard medial parapatellar  approach was performed.  The infrapatellar fat pad was removed.  Suprapatellar synovium was removed to reveal the anterior distal femoral cortex.  A medial peel was performed to release the capsule of the medial tibial plateau.  The patella was then everted and was prepared and sized to a 35 mm.  A cover was placed on the patella for protection from retractors.  He had a bipartite patella.  The knee was then brought into full flexion and we then turned our attention to the femur.  The cruciates were sacrificed.  Start site was drilled in the femur and the intramedullary distal femoral cutting guide was placed, set at 5 degrees valgus, taking 10 mm of distal resection.   There was grade 4 changes of the medial femoral condyle.  The distal cut was made. Osteophytes were then removed.  Next, the proximal tibial cutting guide was placed with appropriate slope, varus/valgus alignment and depth of resection. The proximal tibial cut was made. Gap blocks were then used to assess the extension gap and alignment, and appropriate soft tissue releases were performed. Attention was turned back to the femur, which was sized using the sizing guide to a size 8. Appropriate rotation of the femoral component was determined using epicondylar axis, Whiteside's line, and assessing the flexion gap under ligament tension. The appropriate size 4-in-1 cutting block was placed and checked with an angel wing and cuts were made. Posterior femoral osteophytes and uncapped bone were then removed with the curved osteotome.  Trial components were placed, and stability was checked in full extension, mid-flexion, and deep flexion. Proper tibial rotation was determined and marked.  The patella tracked well without a lateral release.  The femoral lugs were then drilled. Trial components were then removed  and tibial preparation performed.  The medial tibial plateau was sclerotic.  The tibia was sized for a size E component.   The bony surfaces were  irrigated with a pulse lavage and then dried. Bone cement was vacuum mixed on the back table, and the final components sized above were cemented into place.  Antibiotic irrigation was placed in the knee joint and soft tissues while the cement cured.  After cement had finished curing, excess cement was removed. The stability of the construct was re-evaluated throughout a range of motion and found to be acceptable. The trial liner was removed, the knee was copiously irrigated, and the knee was re-evaluated for any excess bone debris. The real polyethylene liner, 12 mm thick, was inserted and checked to ensure the locking mechanism had engaged appropriately. The tourniquet was deflated and hemostasis was achieved. The wound was irrigated with normal saline.  One gram of vancomycin powder was placed in the surgical bed.  Capsular closure was performed with a #1 vicryl, subcutaneous fat closed with a 0 vicryl suture, then subcutaneous tissue closed with interrupted 2.0 vicryl suture. The skin was then closed with a 2.0 nylon and dermabond. A sterile dressing was applied.  The patient was awakened in the operating room and taken to recovery in stable condition. All sponge, needle, and instrument counts were correct at the end of the case.  Tessa Lerner was necessary for opening, closing, retracting, limb positioning and overall facilitation and completion of the surgery.  Position: supine  Complications: none.  Time Out: performed   Drains/Packing: none  Estimated blood loss: minimal  Returned to Recovery Room: in good condition.   Antibiotics: yes   Mechanical VTE (DVT) Prophylaxis: sequential compression devices, TED thigh-high  Chemical VTE (DVT) Prophylaxis: aspirin  Fluid Replacement  Crystalloid: see anesthesia record Blood: none  FFP: none   Specimens Removed: 1 to pathology   Sponge and Instrument Count Correct? yes   PACU: portable radiograph - knee AP and Lateral   Plan/RTC:  Return in 2 weeks for wound check.   Weight Bearing/Load Lower Extremity: full   Implant Name Type Inv. Item Serial No. Manufacturer Lot No. LRB No. Used Action  CEMENT BONE SIMPLEX SPEEDSET - [id number] Cement CEMENT BONE SIMPLEX SPEEDSET  [employer] [id number] Left 2 Wasted  STEM POLY PAT PLY 60M KNEE - [id number] Knees STEM POLY PAT PLY 60M KNEE  [employer](ORTH,TRAU,BIO,SG) [id number] Left 1 Implanted  FEMORAL KNEE COMP SZ 8 STND LT - [id number] Knees FEMORAL KNEE COMP SZ 8 STND LT  [employer]IMMER [employer]ON(ORTH,TRAU,BIO,SG) [id number] Left 1 Implanted  CEMENT BONE R 1X40 - [id number] Cement CEMENT BONE R 1X40  [employer]ON(ORTH,TRAU,BIO,SG) [id number] Left 2 Implanted  STEM ARTISURF EF 12 SZ8-11 - [id number] Stem STEM ARTISURF EF 12 SZ8-11  [employer](ORTH,TRAU,BIO,SG) [id number] Left 1 Implanted  TIBIA STEM 5 DEG SZ E L KNEE - [id number] Knees TIBIA STEM 5 DEG SZ E L KNEE  ZIMMER RECON(ORTH,TRAU,BIO,SG) [id number] Left 1 Implanted    N. Glee Arvin, MD [facility] 9:01 AM

## 2020-11-13 NOTE — Anesthesia Procedure Notes (Signed)
Spinal  Patient location during procedure: OR Start time: 11/13/2020 7:22 AM End time: 11/13/2020 7:25 AM Reason for block: surgical anesthesia Staffing Performed: anesthesiologist  Anesthesiologist: Beryle Lathe, MD Preanesthetic Checklist Completed: patient identified, IV checked, risks and benefits discussed, surgical consent, monitors and equipment checked, pre-op evaluation and timeout performed Spinal Block Patient position: sitting Prep: DuraPrep Patient monitoring: heart rate, cardiac monitor, continuous pulse ox and blood pressure Approach: midline Location: L3-4 Injection technique: single-shot Needle Needle type: Pencan  Needle gauge: 24 G Additional Notes Consent was obtained prior to the procedure with all questions answered and concerns addressed. Risks including, but not limited to, bleeding, infection, nerve damage, paralysis, failed block, inadequate analgesia, allergic reaction, high spinal, itching, and headache were discussed and the patient wished to proceed. Functioning IV was confirmed and monitors were applied. Sterile prep and drape, including hand hygiene, mask, and sterile gloves were used. The patient was positioned and the spine was prepped. The skin was anesthetized with lidocaine. Free flow of clear CSF was obtained prior to injecting local anesthetic into the CSF. The spinal needle aspirated freely following injection. The needle was carefully withdrawn. The patient tolerated the procedure well.   Leslye Peer, MD

## 2020-11-13 NOTE — Anesthesia Procedure Notes (Signed)
Procedure Name: MAC Date/Time: 11/13/2020 7:32 AM Performed by: Annamary Carolin, CRNA Pre-anesthesia Checklist: Patient identified, Emergency Drugs available, Suction available and Patient being monitored Patient Re-evaluated:Patient Re-evaluated prior to induction Preoxygenation: Pre-oxygenation with 100% oxygen Induction Type: IV induction

## 2020-11-13 NOTE — H&P (Signed)
PREOPERATIVE H&P  Chief Complaint: left knee degenerative joint disease  HPI: Randall Calderon is a 55 y.o. male who presents for surgical treatment of left knee degenerative joint disease.  He denies any changes in medical history.  Past Medical History:  Diagnosis Date   Anemia, unspecified 10/29/2017   DM type 2 (diabetes mellitus, type 2) (HCC)    Elevated alkaline phosphatase measurement 10/29/2017   History of hemorrhoids    HSV infection 11/11/2017   HTN (hypertension)    Obesity (BMI 30.0-34.9)    Past Surgical History:  Procedure Laterality Date   KNEE ARTHROSCOPY WITH MENISCAL REPAIR Left 11/26/2019   Procedure: LEFT KNEE ARTHROSCOPY WITH MENISCAL ROOT REPAIR VS. DEBRIDEMENT;  Surgeon: Tarry Kos, MD;  Location: [facility];  Service: Orthopedics;  Laterality: Left;   KNEE SURGERY     18 years ago   TONSILLECTOMY     Social History   Socioeconomic History   Marital status: Married    Spouse name: Not on file   Number of children: Not on file   Years of education: Not on file   Highest education level: Not on file  Occupational History   Not on file  Tobacco Use   Smoking status: Former    Years: 5.00    Types: Cigarettes   Smokeless tobacco: Never  Vaping Use   Vaping Use: Never used  Substance and Sexual Activity   Alcohol use: No   Drug use: No    Comment: history of heroin use and stopped 1998.    Sexual activity: Not on file  Other Topics Concern   Not on file  Social History Narrative   Not on file   Social Determinants of Health   Financial Resource Strain: Not on file  Food Insecurity: Not on file  Transportation Needs: Not on file  Physical Activity: Not on file  Stress: Not on file  Social Connections: Not on file   Family History  Problem Relation Age of Onset   Stroke Mother    Cancer Mother    Throat cancer Father    Diabetes Sister    Hypertension Brother    No Known Allergies Prior to Admission medications    Medication Sig Start Date End Date Taking? Authorizing Provider  atorvastatin (LIPITOR) 40 MG tablet Take 1 tablet by mouth once daily 10/20/20  Yes Henson, Vickie L, NP-C  cyclobenzaprine (FLEXERIL) 5 MG tablet TAKE 1 TO 2 TABLETS BY MOUTH THREE TIMES DAILY AS NEEDED FOR MUSCLE SPASMS. 07/05/20  Yes Cristie Hem, PA-C  FIBER PO Take 1 tablet by mouth daily.   Yes [provider]  ibuprofen (ADVIL) 800 MG tablet Take 1 tablet (800 mg total) by mouth every 8 (eight) hours as needed. 09/27/20  Yes Kirtland Bouchard, PA-C  lisinopril (ZESTRIL) 10 MG tablet Take 1 tablet by mouth once daily 10/20/20  Yes Henson, Vickie L, NP-C  metFORMIN (GLUCOPHAGE) 500 MG tablet TAKE 1 TABLET BY MOUTH TWICE DAILY WITH A MEAL Patient taking differently: Take 500 mg by mouth daily with breakfast. 08/03/20  Yes Henson, Vickie L, NP-C  Multiple Vitamin (MULTIVITAMIN WITH MINERALS) TABS tablet Take 1 tablet by mouth daily.   Yes [provider]  valACYclovir (VALTREX) 1000 MG tablet Take 1 tablet (1,000 mg total) by mouth as needed (for outbreaks.). 05/05/20  Yes Henson, Vickie L, NP-C  aspirin EC 81 MG tablet Take 1 tablet (81 mg total) by mouth 2 (two) times daily.  To be taken after surgery 11/07/20   Cristie Hem, PA-C  docusate sodium (COLACE) 100 MG capsule Take 1 capsule (100 mg total) by mouth daily as needed. 11/07/20 11/07/21  Cristie Hem, PA-C  Glucose Blood (BLOOD GLUCOSE TEST STRIPS) STRP Test once daily. Pt uses one touch verio flex meter 10/24/17   Henson, Vickie L, NP-C  methocarbamol (ROBAXIN) 500 MG tablet Take 1 tablet (500 mg total) by mouth 2 (two) times daily as needed. To be taken after surgery 11/07/20   Cristie Hem, PA-C  ondansetron (ZOFRAN) 4 MG tablet Take 1 tablet (4 mg total) by mouth every 8 (eight) hours as needed for nausea or vomiting. 11/07/20   Cristie Hem, PA-C  ONETOUCH DELICA LANCETS FINE MISC Test once daily. Pt has a onetouch verio flex meter 10/24/17    Henson, Vickie L, NP-C  oxyCODONE-acetaminophen (PERCOCET) 5-325 MG tablet Take 1-2 tablets by mouth every 6 (six) hours as needed. To be taken after surgery 11/07/20   Cristie Hem, PA-C  sulfamethoxazole-trimethoprim (BACTRIM DS) 800-160 MG tablet Take 1 tablet by mouth 2 (two) times daily. To be taken after surgery 11/07/20   Cristie Hem, PA-C     Positive ROS: All other systems have been reviewed and were otherwise negative with the exception of those mentioned in the HPI and as above.  Physical Exam: General: Alert, no acute distress Cardiovascular: No pedal edema Respiratory: No cyanosis, no use of accessory musculature GI: abdomen soft Skin: No lesions in the area of chief complaint Neurologic: Sensation intact distally Psychiatric: Patient is competent for consent with normal mood and affect Lymphatic: no lymphedema  MUSCULOSKELETAL: exam stable  Assessment: left knee degenerative joint disease  Plan: Plan for Procedure(s): LEFT TOTAL KNEE ARTHROPLASTY  The risks benefits and alternatives were discussed with the patient including but not limited to the risks of nonoperative treatment, versus surgical intervention including infection, bleeding, nerve injury,  blood clots, cardiopulmonary complications, morbidity, mortality, among others, and they were willing to proceed.   Preoperative templating of the joint replacement has been completed, documented, and submitted to the Operating Room personnel in order to optimize intra-operative equipment management.   Glee Arvin, MD 11/13/2020 5:56 AM

## 2020-11-14 ENCOUNTER — Encounter (HOSPITAL_COMMUNITY): Payer: Self-pay | Admitting: Orthopaedic Surgery

## 2020-11-14 DIAGNOSIS — M1712 Unilateral primary osteoarthritis, left knee: Secondary | ICD-10-CM | POA: Diagnosis not present

## 2020-11-14 LAB — GLUCOSE, CAPILLARY
Glucose-Capillary: 139 mg/dL — ABNORMAL HIGH (ref 70–99)
Glucose-Capillary: 262 mg/dL — ABNORMAL HIGH (ref 70–99)

## 2020-11-14 LAB — CBC
HCT: 35.8 % — ABNORMAL LOW (ref 39.0–52.0)
Hemoglobin: 11.6 g/dL — ABNORMAL LOW (ref 13.0–17.0)
MCH: 29 pg (ref 26.0–34.0)
MCHC: 32.4 g/dL (ref 30.0–36.0)
MCV: 89.5 fL (ref 80.0–100.0)
Platelets: 149 10*3/uL — ABNORMAL LOW (ref 150–400)
RBC: 4 MIL/uL — ABNORMAL LOW (ref 4.22–5.81)
RDW: 13.5 % (ref 11.5–15.5)
WBC: 9.7 10*3/uL (ref 4.0–10.5)
nRBC: 0 % (ref 0.0–0.2)

## 2020-11-14 LAB — BASIC METABOLIC PANEL
Anion gap: 8 (ref 5–15)
BUN: 12 mg/dL (ref 6–20)
CO2: 26 mmol/L (ref 22–32)
Calcium: 8.7 mg/dL — ABNORMAL LOW (ref 8.9–10.3)
Chloride: 101 mmol/L (ref 98–111)
Creatinine, Ser: 1.33 mg/dL — ABNORMAL HIGH (ref 0.61–1.24)
GFR, Estimated: >60 mL/min (ref >60)
Glucose, Bld: 143 mg/dL — ABNORMAL HIGH (ref 70–99)
Potassium: 4.2 mmol/L (ref 3.5–5.1)
Sodium: 135 mmol/L (ref 135–145)

## 2020-11-14 NOTE — Plan of Care (Signed)
Pt given D/C instructions with verbal understanding. Rx's were sent to the pharmacy by MD. Pt's incision has no sign of infection. Pt's IV was removed prior to D/C. Pt's Home Health was arranged by MD office prior to surgery. Pt's RW and 3-n-1 will be delivered to his house d/t workmans comp preference. Pt D/C'd home via wheelchair per MD order. Pt is stable @ D/C and has no other needs at this time. Rema Fendt, RN

## 2020-11-14 NOTE — Progress Notes (Signed)
Physical Therapy Treatment Patient Details Name: Randall Calderon MRN: [id number] DOB: 10-14-1965 Today's Date: 11/14/2020   History of Present Illness Pt is a 55 y/o male s/p L TKA on 10/3. PMH includes DM and HTN.    PT Comments    Pt denies nausea this date. Pt eager to mobilize. Tolerated L HEP well (handout provided) and began step through gait training with RW. Pt remains very dependent on bilat UEs due to L knee pain with WBing. Pt also with significant L calf swelling and L knee swelling due to L knee high ted hose that was donned and had rolled to below the knee presenting in a tourniquet fashion. Ted hose removed and thigh ordered after speaking with [place], [place]. Acute PT to return later today to complete stair training.     Recommendations for follow up therapy are one component of a multi-disciplinary discharge planning process, led by the attending physician.  Recommendations may be updated based on patient status, additional functional criteria and insurance authorization.  Follow Up Recommendations  Follow surgeon's recommendation for DC plan and follow-up therapies     Equipment Recommendations  Rolling walker with 5" wheels;3in1 (PT)    Recommendations for Other Services       Precautions / Restrictions Precautions Precautions: Knee Precaution Booklet Issued: Yes (comment) Precaution Comments: Verbally reviewed knee precautions with pt and went over beginner L knee HEP Restrictions Weight Bearing Restrictions: Yes LLE Weight Bearing: Weight bearing as tolerated     Mobility  Bed Mobility Overal bed mobility: Modified Independent Bed Mobility: Supine to Sit     Supine to sit: Modified independent (Device/Increase time);HOB elevated Sit to supine: Modified independent (Device/Increase time);HOB elevated   General bed mobility comments: HOB flat, educated on long sit, no physical assist needed, educated to complete L quad set and then slide LE off EOB, pt with  could carry over    Transfers Overall transfer level: Needs assistance Equipment used: Rolling walker (2 wheeled) Transfers: Sit to/from Stand Sit to Stand: Min guard         General transfer comment: verbal cues for safe hand placement, increased time, min guard for safety  Ambulation/Gait Ambulation/Gait assistance: Min guard Gait Distance (Feet): 100 Feet Assistive device: Rolling walker (2 wheeled) Gait Pattern/deviations: Step-to pattern;Step-through pattern;Decreased stride length;Trunk flexed;Narrow base of support Gait velocity: dec Gait velocity interpretation: <1.31 ft/sec, indicative of household ambulator General Gait Details: pt with L knee buckling first 2 steps but once pt began completing quad set on L LE while advancing R LE  pt with no more buckling, pt was then able to transition to step through gait pattern with decreased step length, pt continues with significant bilat UE dependency due to L knee pain at 8/10   Stairs             Wheelchair Mobility    Modified Rankin (Stroke Patients Only)       Balance Overall balance assessment: Needs assistance Sitting-balance support: No upper extremity supported;Feet supported Sitting balance-Leahy Scale: Good     Standing balance support: Bilateral upper extremity supported;During functional activity Standing balance-Leahy Scale: Poor Standing balance comment: reliant on UE support in standing due to painful LLE                            Cognition Arousal/Alertness: Awake/alert Behavior During Therapy: WFL for tasks assessed/performed Overall Cognitive Status: Within Functional Limits for tasks assessed  General Comments: pt gave good effort and with good understanding of HEP      Exercises Total Joint Exercises Ankle Circles/Pumps: AROM;Both;5 reps Quad Sets: AROM;Left;10 reps;Supine (with 3 sec hold) Heel Slides: AROM;AAROM;Left;10  reps;Seated Goniometric ROM: 42 deg active in sitting, 65 deg passive    General Comments General comments (skin integrity, edema, etc.): pt found to have L knee high ted hose one which was rolled down below L knee presenting in tournequet fashion, spoke with Mardella Layman, [place] who stated he should have thigh high (order placed) and asked PT to remove it. Pt with noted swelling in knee and calf, pt felt immediate relief once ted hose removed.provided manual massage to help move some swelling      Pertinent Vitals/Pain Pain Assessment: 0-10 Pain Score: 8  Pain Location: L knee s/p PT Pain Descriptors / Indicators: Grimacing;Guarding Pain Intervention(s): Monitored during session    Home Living Family/patient expects to be discharged to:: Private residence Living Arrangements: Spouse/significant other;Children Available Help at Discharge: Family;Available PRN/intermittently Type of Home: House Home Access: Stairs to enter Entrance Stairs-Rails: None Home Layout: One level Home Equipment: None      Prior Function Level of Independence: Independent          PT Goals (current goals can now be found in the care plan section) Acute Rehab PT Goals Patient Stated Goal: to decrease pain PT Goal Formulation: With patient Time For Goal Achievement: 11/27/20 Potential to Achieve Goals: Good Progress towards PT goals: Progressing toward goals    Frequency    7X/week      PT Plan Current plan remains appropriate    Co-evaluation              AM-PAC PT "6 Clicks" Mobility   Outcome Measure  Help needed turning from your back to your side while in a flat bed without using bedrails?: A Little Help needed moving from lying on your back to sitting on the side of a flat bed without using bedrails?: A Little Help needed moving to and from a bed to a chair (including a wheelchair)?: A Little Help needed standing up from a chair using your arms (e.g., wheelchair or bedside chair)?: A  Little Help needed to walk in hospital room?: A Little Help needed climbing 3-5 steps with a railing? : A Lot 6 Click Score: 17    End of Session Equipment Utilized During Treatment: Gait belt Activity Tolerance: Patient tolerated treatment well Patient left: with call bell/phone within reach;in chair Nurse Communication: Mobility status PT Visit Diagnosis: Unsteadiness on feet (R26.81);Muscle weakness (generalized) (M62.81);Difficulty in walking, not elsewhere classified (R26.2);Pain Pain - Right/Left: Left Pain - part of body: Knee     Time: 0814-4818 PT Time Calculation (min) (ACUTE ONLY): 32 min  Charges:  $Gait Training: 8-22 mins $Therapeutic Exercise: 8-22 mins                     [name], PT, DPT Acute Rehabilitation Services Pager #: [phone] Office #: [phone]    [name] 11/14/2020, 9:04 AM

## 2020-11-14 NOTE — Progress Notes (Signed)
Physical Therapy Treatment Patient Details Name: Randall Calderon MRN: [id number] DOB: Mar 07, 1965 Today's Date: 11/14/2020   History of Present Illness Pt is a 55 y/o male s/p L TKA on 10/3. PMH includes DM and HTN.    PT Comments    Pt returned to complete stair negotiation. Pt returned demonstrated both backwards with RW with PT to help hold walker and R HHA, forward, as pt has no hand rails and is unsure if the walker will fit on the step. Pt with good recall of L knee exercises and reported doing them after PT left this morning. Acute PT to cont to follow.    Recommendations for follow up therapy are one component of a multi-disciplinary discharge planning process, led by the attending physician.  Recommendations may be updated based on patient status, additional functional criteria and insurance authorization.  Follow Up Recommendations  Follow surgeon's recommendation for DC plan and follow-up therapies     Equipment Recommendations  Rolling walker with 5" wheels;3in1 (PT)    Recommendations for Other Services       Precautions / Restrictions Precautions Precautions: Knee Precaution Booklet Issued: Yes (comment) Precaution Comments: Verbally reviewed knee precautions with pt and went over beginner L knee HEP Restrictions Weight Bearing Restrictions: Yes LLE Weight Bearing: Weight bearing as tolerated     Mobility  Bed Mobility Overal bed mobility: Modified Independent Bed Mobility: Supine to Sit;Sit to Supine     Supine to sit: Modified independent (Device/Increase time);HOB elevated Sit to supine: Modified independent (Device/Increase time);HOB elevated   General bed mobility comments: pt with good carry over of long sit technique    Transfers Overall transfer level: Needs assistance Equipment used: Rolling walker (2 wheeled) Transfers: Sit to/from Stand Sit to Stand: Min guard         General transfer comment: increased time, good technique, slow and  guarded  Ambulation/Gait Ambulation/Gait assistance: Min guard Gait Distance (Feet): 150 Feet Assistive device: Rolling walker (2 wheeled) Gait Pattern/deviations: Step-through pattern;Decreased stride length;Trunk flexed;Narrow base of support Gait velocity: dec Gait velocity interpretation: <1.31 ft/sec, indicative of household ambulator General Gait Details: v/c's to relax shlders and increased WBing on bilat LEs and decrease through UEs, pt with 2 episodes of sharp posterior knee pain when pt attempted to increased step length and felt he "hyperextended" the knee however pt didn't require assist to keep balance   Stairs Stairs: Yes Stairs assistance: Min assist Stair Management: Step to pattern;Backwards;With walker Number of Stairs: 2 General stair comments: pt completed stair negotiation both with RW backwards and with R HHA as pt unsure if the walker would fit up on the step, pt with good carry over, and understand "up with the good, down with the bad"   Wheelchair Mobility    Modified Rankin (Stroke Patients Only)       Balance Overall balance assessment: Needs assistance Sitting-balance support: No upper extremity supported;Feet supported Sitting balance-Leahy Scale: Good     Standing balance support: Bilateral upper extremity supported;During functional activity Standing balance-Leahy Scale: Poor Standing balance comment: reliant on UE support in standing due to painful LLE                            Cognition Arousal/Alertness: Awake/alert Behavior During Therapy: WFL for tasks assessed/performed Overall Cognitive Status: Within Functional Limits for tasks assessed  Exercises Total Joint Exercises Quad Sets:  (with 3 sec hold) Heel Slides: AROM;AAROM;Left;10 reps;Seated Long Arc Quad: AROM;Left;10 reps;Seated    General Comments General comments (skin integrity, edema, etc.): L calf muscles  less tight and sensitive compared to this morning      Pertinent Vitals/Pain Pain Assessment: 0-10 Pain Score: 5  Pain Location: L knee Pain Descriptors / Indicators: Grimacing;Guarding    Home Living                      Prior Function            PT Goals (current goals can now be found in the care plan section) Acute Rehab PT Goals PT Goal Formulation: With patient Time For Goal Achievement: 11/27/20 Potential to Achieve Goals: Good Progress towards PT goals: Progressing toward goals    Frequency    7X/week      PT Plan Current plan remains appropriate    Co-evaluation              AM-PAC PT "6 Clicks" Mobility   Outcome Measure  Help needed turning from your back to your side while in a flat bed without using bedrails?: A Little Help needed moving from lying on your back to sitting on the side of a flat bed without using bedrails?: A Little Help needed moving to and from a bed to a chair (including a wheelchair)?: A Little Help needed standing up from a chair using your arms (e.g., wheelchair or bedside chair)?: A Little Help needed to walk in hospital room?: A Little Help needed climbing 3-5 steps with a railing? : A Lot 6 Click Score: 17    End of Session Equipment Utilized During Treatment: Gait belt Activity Tolerance: Patient tolerated treatment well Patient left: with call bell/phone within reach;in bed Nurse Communication: Mobility status PT Visit Diagnosis: Unsteadiness on feet (R26.81);Muscle weakness (generalized) (M62.81);Difficulty in walking, not elsewhere classified (R26.2);Pain Pain - Right/Left: Left Pain - part of body: Knee     Time: 1151-1220 PT Time Calculation (min) (ACUTE ONLY): 29 min  Charges:  $Gait Training: 23-37 mins                     [name], PT, DPT Acute Rehabilitation Services Pager #: [phone] Office #: [phone]    [name] 11/14/2020, 12:37 PM

## 2020-11-14 NOTE — Discharge Summary (Signed)
Patient ID: Randall Calderon MRN: [id number] DOB/AGE: [birth year] 55 y.o.  Admit date: 11/13/2020 Discharge date: 11/14/2020  Admission Diagnoses:  Principal Problem:   Primary osteoarthritis of left knee Active Problems:   Status post total left knee replacement   Discharge Diagnoses:  Same  Past Medical History:  Diagnosis Date   Anemia, unspecified 10/29/2017   DM type 2 (diabetes mellitus, type 2) (HCC)    Elevated alkaline phosphatase measurement 10/29/2017   History of hemorrhoids    HSV infection 11/11/2017   HTN (hypertension)    Obesity (BMI 30.0-34.9)     Surgeries: Procedure(s): LEFT TOTAL KNEE ARTHROPLASTY on 11/13/2020   Consultants:   Discharged Condition: Improved  Hospital Course: Randall Calderon is an 55 y.o. male who was admitted 11/13/2020 for operative treatment ofPrimary osteoarthritis of left knee. Patient has severe unremitting pain that affects sleep, daily activities, and work/hobbies. After pre-op clearance the patient was taken to the operating room on 11/13/2020 and underwent  Procedure(s): LEFT TOTAL KNEE ARTHROPLASTY.    Patient was given perioperative antibiotics:  Anti-infectives (From admission, onward)    Start     Dose/Rate Route Frequency Ordered Stop   11/13/20 1300  ceFAZolin (ANCEF) IVPB 2g/100 mL premix        2 g 200 mL/hr over 30 Minutes Intravenous Every 6 hours 11/13/20 1026 11/13/20 1835   11/13/20 0913  vancomycin (VANCOCIN) powder  Status:  Discontinued          As needed 11/13/20 0913 11/13/20 0935   11/13/20 0600  ceFAZolin (ANCEF) IVPB 2g/100 mL premix        2 g 200 mL/hr over 30 Minutes Intravenous On call to O.R. 11/13/20 0543 11/13/20 0724        Patient was given sequential compression devices, early ambulation, and chemoprophylaxis to prevent DVT.  Patient benefited maximally from hospital stay and there were no complications.    Recent vital signs: Patient Vitals for the past 24 hrs:  BP Temp Temp src Pulse Resp SpO2   11/14/20 0741 111/71 98.7 F (37.1 C) Oral 67 18 97 %  11/14/20 0314 117/71 98.8 F (37.1 C) Oral 67 20 98 %  11/13/20 2311 136/85 99.1 F (37.3 C) Oral 75 20 97 %  11/13/20 1918 125/78 98.5 F (36.9 C) Oral 66 20 97 %  11/13/20 1554 120/79 97.9 F (36.6 C) -- (!) 50 18 92 %  11/13/20 1040 122/90 -- -- 68 18 99 %  11/13/20 1010 115/89 (!) 97.5 F (36.4 C) -- 69 14 99 %  11/13/20 0955 118/87 -- -- (!) 59 13 100 %  11/13/20 0943 -- -- -- -- -- 99 %  11/13/20 0940 111/75 97.7 F (36.5 C) -- 73 13 100 %     Recent laboratory studies:  Recent Labs    11/14/20 0505  WBC 9.7  HGB 11.6*  HCT 35.8*  PLT 149*  NA 135  K 4.2  CL 101  CO2 26  BUN 12  CREATININE 1.33*  GLUCOSE 143*  CALCIUM 8.7*     Discharge Medications:   Allergies as of 11/14/2020   No Known Allergies      Medication List     STOP taking these medications    cyclobenzaprine 5 MG tablet Commonly known as: FLEXERIL   ibuprofen 800 MG tablet Commonly known as: ADVIL       TAKE these medications    aspirin EC 81 MG tablet Take 1 tablet (81 mg total) by mouth 2 (  two) times daily. To be taken after surgery   atorvastatin 40 MG tablet Commonly known as: LIPITOR Take 1 tablet by mouth once daily   BLOOD GLUCOSE TEST STRIPS Strp Test once daily. Pt uses one touch verio flex meter   docusate sodium 100 MG capsule Commonly known as: Colace Take 1 capsule (100 mg total) by mouth daily as needed.   FIBER PO Take 1 tablet by mouth daily.   lisinopril 10 MG tablet Commonly known as: ZESTRIL Take 1 tablet by mouth once daily   metFORMIN 500 MG tablet Commonly known as: GLUCOPHAGE TAKE 1 TABLET BY MOUTH TWICE DAILY WITH A MEAL What changed: when to take this   methocarbamol 500 MG tablet Commonly known as: Robaxin Take 1 tablet (500 mg total) by mouth 2 (two) times daily as needed. To be taken after surgery   multivitamin with minerals Tabs tablet Take 1 tablet by mouth daily.    ondansetron 4 MG tablet Commonly known as: Zofran Take 1 tablet (4 mg total) by mouth every 8 (eight) hours as needed for nausea or vomiting.   OneTouch Delica Lancets Fine Misc Test once daily. Pt has a onetouch verio flex meter   oxyCODONE-acetaminophen 5-325 MG tablet Commonly known as: Percocet Take 1-2 tablets by mouth every 6 (six) hours as needed. To be taken after surgery   sulfamethoxazole-trimethoprim 800-160 MG tablet Commonly known as: BACTRIM DS Take 1 tablet by mouth 2 (two) times daily. To be taken after surgery   valACYclovir 1000 MG tablet Commonly known as: Valtrex Take 1 tablet (1,000 mg total) by mouth as needed (for outbreaks.).               Durable Medical Equipment  (From admission, onward)           Start     Ordered   11/13/20 1027  DME Walker rolling  Once       Question Answer Comment  Walker: With 5 Inch Wheels   Patient needs a walker to treat with the following condition Status post left partial knee replacement      11/13/20 1026   11/13/20 1027  DME 3 n 1  Once        11/13/20 1026   11/13/20 1027  DME Bedside commode  Once       Question:  Patient needs a bedside commode to treat with the following condition  Answer:  Status post left partial knee replacement   11/13/20 1026            Diagnostic Studies: DG Knee Left Port  Result Date: 11/13/2020 CLINICAL DATA:  Status post left total knee arthroplasty. EXAM: PORTABLE LEFT KNEE - 1-2 VIEW COMPARISON:  09/11/2020 FINDINGS: Postoperative changes from left total knee arthroplasty. Hardware components are in anatomic alignment. No periprosthetic fracture or dislocation. Soft tissue gas and swelling noted within the suprapatellar joint space and anterior soft tissues. IMPRESSION: Status post left total knee arthroplasty. Electronically Signed   By: Signa Kell M.D.   On: 11/13/2020 10:22    Disposition: Discharge disposition: 01-Home or Self Care          Follow-up  Information     Tarry Kos, MD. Schedule an appointment as soon as possible for a visit in 2 week(s).   Specialty: Orthopedic Surgery Contact information: [address] [place] [address] [phone]                  Signed: Cristie Hem  11/14/2020, 8:04 AM

## 2020-11-14 NOTE — Progress Notes (Signed)
Subjective: 1 Day Post-Op Procedure(s) (LRB): LEFT TOTAL KNEE ARTHROPLASTY (Left) Patient reports pain as moderate.  Some nausea with narcotics, but does well with zofran  Objective: Vital signs in last 24 hours: Temp:  [97.5 F (36.4 C)-99.1 F (37.3 C)] 98.7 F (37.1 C) (10/04 0741) Pulse Rate:  [50-75] 67 (10/04 0741) Resp:  [13-20] 18 (10/04 0741) BP: (111-136)/(71-90) 111/71 (10/04 0741) SpO2:  [92 %-100 %] 97 % (10/04 0741) FiO2 (%):  [21 %] 21 % (10/03 0943)  Intake/Output from previous day: 10/03 0701 - 10/04 0700 In: 800 [I.V.:800] Out: 2625 [Urine:2600; Blood:25] Intake/Output this shift: No intake/output data recorded.  Recent Labs    11/14/20 0505  HGB 11.6*   Recent Labs    11/14/20 0505  WBC 9.7  RBC 4.00*  HCT 35.8*  PLT 149*   Recent Labs    11/14/20 0505  NA 135  K 4.2  CL 101  CO2 26  BUN 12  CREATININE 1.33*  GLUCOSE 143*  CALCIUM 8.7*   No results for input(s): LABPT, INR in the last 72 hours.  Neurologically intact Neurovascular intact Sensation intact distally Intact pulses distally Dorsiflexion/Plantar flexion intact Incision: scant drainage No cellulitis present Compartment soft   Assessment/Plan: 1 Day Post-Op Procedure(s) (LRB): LEFT TOTAL KNEE ARTHROPLASTY (Left) Advance diet Up with therapy Discharge home with home health WBAT LLE ABLA- mild and stable   Anticipated LOS equal to or greater than 2 midnights due to - Age 56 and older with one or more of the following:  - Obesity  - Expected need for hospital services (PT, OT, Nursing) required for safe  discharge  - Anticipated need for postoperative skilled nursing care or inpatient rehab  - Active co-morbidities: Diabetes OR   - Unanticipated findings during/Post Surgery: Slow post-op progression: GI, pain control, mobility  - Patient is a high risk of re-admission due to: None   Cristie Hem 11/14/2020, 7:54 AM

## 2020-11-14 NOTE — Evaluation (Signed)
Occupational Therapy Evaluation Patient Details Name: Randall Calderon MRN: [id number] DOB: 1965/08/19 Today's Date: 11/14/2020   History of Present Illness Pt is a 55 y/o male s/p L TKA on 10/3. PMH includes DM and HTN.   Clinical Impression   PTA, pt lives with spouse and reports Independence with all daily tasks. Pt presents now with expected post op pain of L LE and difficulty reaching feet for LB ADLs. Educated on compensatory strategies, use of AE and wife assist for LB ADLs at home. Collaborated on tub transfer DME options with tub bench likely the most helpful due to significant L LE pain when weightbearing. Educated on use of BSC over toilet to increase height and ease for transfers. Provided handouts on DME, tub transfers and AE for pt reference. Will continue to follow acutely to further assess tub transfers though anticipate not OT needs at DC.      Recommendations for follow up therapy are one component of a multi-disciplinary discharge planning process, led by the attending physician.  Recommendations may be updated based on patient status, additional functional criteria and insurance authorization.   Follow Up Recommendations  No OT follow up;Supervision - Intermittent    Equipment Recommendations  3 in 1 bedside commode;Tub/shower bench;Other (comment) (Rolling walker; pt will discuss tub bench with case manager)    Recommendations for Other Services       Precautions / Restrictions Precautions Precautions: Knee Precaution Booklet Issued: No Restrictions Weight Bearing Restrictions: Yes LLE Weight Bearing: Weight bearing as tolerated      Mobility Bed Mobility Overal bed mobility: Modified Independent Bed Mobility: Supine to Sit;Sit to Supine     Supine to sit: Modified independent (Device/Increase time);HOB elevated Sit to supine: Modified independent (Device/Increase time);HOB elevated   General bed mobility comments: increased time/effort but pt able to safely  negotiate L LE off of bed    Transfers Overall transfer level: Needs assistance Equipment used: Rolling walker (2 wheeled) Transfers: Sit to/from Stand Sit to Stand: Supervision         General transfer comment: for safety, no physical assist needed though increased time taken to gradually WB through L LE    Balance Overall balance assessment: Needs assistance Sitting-balance support: No upper extremity supported;Feet supported Sitting balance-Leahy Scale: Good     Standing balance support: Bilateral upper extremity supported;During functional activity Standing balance-Leahy Scale: Poor Standing balance comment: reliant on UE support in standing due to painful LLE                           ADL either performed or assessed with clinical judgement   ADL Overall ADL's : Needs assistance/impaired Eating/Feeding: Independent   Grooming: Modified independent;Standing   Upper Body Bathing: Independent;Sitting   Lower Body Bathing: Minimal assistance;Sit to/from stand   Upper Body Dressing : Independent;Sitting   Lower Body Dressing: Minimal assistance;Sit to/from stand;With adaptive equipment Lower Body Dressing Details (indicate cue type and reason): pt able to don gym shorts easily by bending to feet sitting EOB.Unable to don tennis shoe, partially due to swelling. educated on use of shoehorn (provided AE handout with general info on other equipment). Collaborated on easier clothing to don/manage at home and where wife assist may be needed Toilet Transfer: Min guard;Ambulation;RW   Toileting- Clothing Manipulation and Hygiene: Supervision/safety;Sit to/from stand;Sitting/lateral lean     Tub/Shower Transfer Details (indicate cue type and reason): Discussed various DME for shower use (BSC vs shower chair, tub bench  that will avoid sole WB through operative LE due to significant pain at this time). Provided handout on DME, tub transfer with RW/BSC. Encouraged pt to  discuss shower chair coverage options with worker's comp Sports coach and determine what works best for [place] ADL Comments: Pt limited by expected post op L LE pain and difficulty reaching foot for LB ADLs     Vision Baseline Vision/History: 1 Wears glasses Ability to See in Adequate Light: 0 Adequate Patient Visual Report: No change from baseline Vision Assessment?: No apparent visual deficits     Perception     Praxis      Pertinent Vitals/Pain Pain Assessment: 0-10 Pain Score: 5  Pain Location: L knee Pain Descriptors / Indicators: Grimacing;Guarding Pain Intervention(s): Monitored during session     Hand Dominance Right   Extremity/Trunk Assessment Upper Extremity Assessment Upper Extremity Assessment: Overall WFL for tasks assessed   Lower Extremity Assessment Lower Extremity Assessment: Defer to PT evaluation   Cervical / Trunk Assessment Cervical / Trunk Assessment: Normal   Communication Communication Communication: No difficulties   Cognition Arousal/Alertness: Awake/alert Behavior During Therapy: WFL for tasks assessed/performed Overall Cognitive Status: Within Functional Limits for tasks assessed                                     General Comments       Exercises     Shoulder Instructions      Home Living Family/patient expects to be discharged to:: Private residence Living Arrangements: Spouse/significant other;Children Available Help at Discharge: Family;Available PRN/intermittently Type of Home: House Home Access: Stairs to enter [employer] of Steps: 1 Entrance Stairs-Rails: None Home Layout: One level     Bathroom Shower/Tub: Chief Strategy Officer: Standard (low per pt)     Home Equipment: None          Prior Functioning/Environment Level of Independence: Independent                 OT Problem List: Pain;Decreased knowledge of precautions;Decreased knowledge of use of  DME or AE      OT Treatment/Interventions: Self-care/ADL training;Therapeutic exercise;Energy conservation;DME and/or AE instruction;Therapeutic activities;Balance training;Patient/family education    OT Goals(Current goals can be found in the care plan section) Acute Rehab OT Goals Patient Stated Goal: to decrease pain OT Goal Formulation: With patient Time For Goal Achievement: 11/28/20 Potential to Achieve Goals: Good  OT Frequency: Min 2X/week   Barriers to D/C:            Co-evaluation              AM-PAC OT "6 Clicks" Daily Activity     Outcome Measure Help from another person eating meals?: None Help from another person taking care of personal grooming?: None Help from another person toileting, which includes using toliet, bedpan, or urinal?: A Little Help from another person bathing (including washing, rinsing, drying)?: A Little Help from another person to put on and taking off regular upper body clothing?: None Help from another person to put on and taking off regular lower body clothing?: A Little 6 Click Score: 21   End of Session Equipment Utilized During Treatment: Rolling walker Nurse Communication: Mobility status  Activity Tolerance: Patient tolerated treatment well Patient left: in bed;with call bell/phone within reach  OT Visit Diagnosis: Unsteadiness on feet (R26.81);Other abnormalities of gait and mobility (R26.89);Muscle weakness (generalized) (M62.81);Pain  Pain - Right/Left: Left Pain - part of body: Knee                Time: 5003-7048 OT Time Calculation (min): 18 min Charges:  OT General Charges $OT Visit: 1 Visit OT Evaluation $OT Eval Low Complexity: 1 Low  Bradd Canary, OTR/L Acute Rehab Services Office: [phone]   Lorre Munroe 11/14/2020, 7:38 AM

## 2020-11-15 ENCOUNTER — Telehealth: Payer: Self-pay | Admitting: Orthopaedic Surgery

## 2020-11-15 NOTE — Telephone Encounter (Signed)
Pt has a question about what his knee placement is called?   CB [phone]

## 2020-11-15 NOTE — Telephone Encounter (Signed)
[facility] persona

## 2020-11-16 ENCOUNTER — Telehealth: Payer: Self-pay | Admitting: Orthopaedic Surgery

## 2020-11-16 NOTE — Telephone Encounter (Signed)
June (PT) from [facility] called requesting verbal orders for pt home health 3wk 1 and 2 wk 2. Please call June back and if he is unable to answer please leave vm on secure line at [phone].

## 2020-11-16 NOTE — Telephone Encounter (Signed)
Sent mychart msg.

## 2020-11-16 NOTE — Telephone Encounter (Signed)
Called to approve orders 

## 2020-11-20 ENCOUNTER — Telehealth: Payer: Self-pay | Admitting: Orthopaedic Surgery

## 2020-11-20 NOTE — Telephone Encounter (Signed)
June (PT) from [facility] called requesting orders for doppler ultrasound for DVT of left leg. Pt had total hip replacement complaining of pains in left muscle thigh  . Please call pt when referral has been sent. If any questions call June at [phone]. Pt phone number is [phone].

## 2020-11-21 ENCOUNTER — Ambulatory Visit (HOSPITAL_COMMUNITY)
Admission: RE | Admit: 2020-11-21 | Discharge: 2020-11-21 | Disposition: A | Discharge status: Home / Self Care | Payer: 59 | Source: Ambulatory Visit | Attending: Cardiology | Admitting: Cardiology

## 2020-11-21 ENCOUNTER — Other Ambulatory Visit: Payer: Self-pay

## 2020-11-21 ENCOUNTER — Telehealth: Payer: Self-pay | Admitting: Orthopaedic Surgery

## 2020-11-21 ENCOUNTER — Other Ambulatory Visit: Payer: Self-pay | Admitting: Orthopaedic Surgery

## 2020-11-21 DIAGNOSIS — M79662 Pain in left lower leg: Secondary | ICD-10-CM

## 2020-11-21 DIAGNOSIS — M1712 Unilateral primary osteoarthritis, left knee: Secondary | ICD-10-CM

## 2020-11-21 MED ORDER — RIVAROXABAN (XARELTO) VTE STARTER PACK (15 & 20 MG): ORAL_TABLET | ORAL | 0 refills | Status: DC

## 2020-11-21 NOTE — Telephone Encounter (Signed)
Order is already made.

## 2020-11-21 NOTE — Progress Notes (Signed)
Spoke to patient tonight.  He states that he wasn't able to get the xarelto due to insurance issue but pharmacy will let him know.  Is there something we can do to push it through so he can start the xarelto.  Also he will make appt with PCP to talk about long term treatment.  Thanks.

## 2020-11-21 NOTE — Telephone Encounter (Signed)
Pt called with several refill request. Please call pt to verify all medication refills to go to [facility]. Pt need methocarbomal, oxycodone, stool softer, and one more medication but couldn't make out which one it is. Please call pt at [phone].

## 2020-11-22 ENCOUNTER — Telehealth: Payer: Self-pay | Admitting: Orthopaedic Surgery

## 2020-11-22 NOTE — Telephone Encounter (Signed)
Patient called. He would like refills on oxycodone, aspirin, robaxin, Zofran and stool softeners called in. His call back number is [phone]

## 2020-11-23 ENCOUNTER — Other Ambulatory Visit: Payer: Self-pay

## 2020-11-23 ENCOUNTER — Other Ambulatory Visit: Payer: Self-pay | Admitting: Physician Assistant

## 2020-11-23 DIAGNOSIS — M79662 Pain in left lower leg: Secondary | ICD-10-CM

## 2020-11-23 MED ORDER — OXYCODONE-ACETAMINOPHEN 5-325 MG PO TABS: 1.0000 | ORAL_TABLET | Freq: Four times a day (QID) | ORAL | 0 refills | Status: DC | PRN

## 2020-11-23 MED ORDER — METHOCARBAMOL 500 MG PO TABS: 500.0000 mg | ORAL_TABLET | Freq: Two times a day (BID) | ORAL | 0 refills | Status: DC | PRN

## 2020-11-23 MED ORDER — DOCUSATE SODIUM 100 MG PO CAPS: 100.0000 mg | ORAL_CAPSULE | Freq: Every day | ORAL | 2 refills | Status: DC | PRN

## 2020-11-23 MED ORDER — ONDANSETRON HCL 4 MG PO TABS: 4.0000 mg | ORAL_TABLET | Freq: Three times a day (TID) | ORAL | 0 refills | Status: DC | PRN

## 2020-11-23 NOTE — Telephone Encounter (Signed)
Per Randall Calderon pt received the medication yesterday

## 2020-11-23 NOTE — Telephone Encounter (Signed)
Sent in everything except aspirin  Dr. Roda Shutters is talking to Randall Calderon to try and figure out the hang up on the xarelto as this is what he needs to be taking for dvt.  Continue with aspirin previously called in until he gets this

## 2020-11-23 NOTE — Telephone Encounter (Signed)
Looks like [facility] sent it in today.

## 2020-11-26 DIAGNOSIS — I82452 Acute embolism and thrombosis of left peroneal vein: Secondary | ICD-10-CM | POA: Insufficient documentation

## 2020-11-26 NOTE — Progress Notes (Signed)
Chief Complaint  Patient presents with   Hospitalization Follow-up    Hospital follow up. His main concern is that PT wants to come to the house tomorrow and start home therapy or should hold off. Knee replacement surgery was 11/13/20 and DVT was 11/21/20. Would also like to know if he is supposed to be taking ASA and Xarelto.     He is s/p L total knee replacement on 11/13/20 by Dr. Erlinda Hong.  He had dopplers of BLE on 11/21/20 ,which showed acute DVT of L peroneal veins, and he was started on Xarelto. He is still taking the aspirin that was prescribed post-op.  Summary:  RIGHT:  - No evidence of deep vein thrombosis in the lower extremity. No indirect  evidence of obstruction proximal to the inguinal ligament.  - No cystic structure found in the popliteal fossa.     LEFT:  - Findings consistent with acute deep vein thrombosis involving the left  peroneal veins.  - No cystic structure found in the popliteal fossa.  - All other veins visualized appear fully compressible and demonstrate  appropriate Doppler characteristics.   He has a discomfort at the lower half of the left calf, slightly tingling, pulling/tightness. He originally had more swelling, redness, felt hot, and was tender.  He reports that the swelling is improving, no longer feels hot. He has been keeping the leg elevated.  He has put some ice at the knee, and sometimes also to the calf.  He walked more than usual today, is in a lot of pain in the office (13/10, per pt).   Patient was formerly under the care of Har[facility] Dingwall, [place].  Last seen in 04/2020 for CPE. He reports that he gained weight after a fall, and then sugars went up, and diagnosed with DM, HTN.   DM--on metformin Lab Results  Component Value Date   HGBA1C 6.2 (H) 09/11/2020   HLD--on statin: Lab Results  Component Value Date   CHOL 133 05/05/2020   HDL 41 05/05/2020   LDLCALC 79 05/05/2020   TRIG 65 05/05/2020   CHOLHDL 3.2 05/05/2020   HTN--on  lisinopril: BP Readings from Last 3 Encounters:  11/27/20 140/86  11/14/20 111/71  11/09/20 129/90     PMH, PSH, SH reviewed FH reviewed--no h/o blood clots, PE, other clotting abnormalities.  Outpatient Encounter Medications as of 11/27/2020  Medication Sig   aspirin EC 81 MG tablet Take 1 tablet (81 mg total) by mouth 2 (two) times daily. To be taken after surgery   atorvastatin (LIPITOR) 40 MG tablet Take 1 tablet by mouth once daily   docusate sodium (COLACE) 100 MG capsule Take 1 capsule (100 mg total) by mouth daily as needed.   FIBER PO Take 1 tablet by mouth daily.   Glucose Blood (BLOOD GLUCOSE TEST STRIPS) STRP Test once daily. Pt uses one touch verio flex meter   lisinopril (ZESTRIL) 10 MG tablet Take 1 tablet by mouth once daily   metFORMIN (GLUCOPHAGE) 500 MG tablet TAKE 1 TABLET BY MOUTH TWICE DAILY WITH A MEAL (Patient taking differently: Take 500 mg by mouth daily with breakfast.)   methocarbamol (ROBAXIN) 500 MG tablet Take 1 tablet (500 mg total) by mouth 2 (two) times daily as needed. To be taken after surgery   Multiple Vitamin (MULTIVITAMIN WITH MINERALS) TABS tablet Take 1 tablet by mouth daily.   oxyCODONE-acetaminophen (PERCOCET) 5-325 MG tablet Take 1-2 tablets by mouth every 6 (six) hours as needed. To be taken after surgery  RIVAROXABAN (XARELTO) VTE STARTER PACK (15 & 20 MG) Follow package directions: Take one 27m tablet by mouth twice a day. On day 22, switch to one 2108mtablet once a day. Take with food.   [DISCONTINUED] ONETOUCH DELICA LANCETS FINE MISC Test once daily. Pt has a onetouch verio flex meter   ondansetron (ZOFRAN) 4 MG tablet Take 1 tablet (4 mg total) by mouth every 8 (eight) hours as needed for nausea or vomiting. (Patient not taking: Reported on 11/27/2020)   sulfamethoxazole-trimethoprim (BACTRIM DS) 800-160 MG tablet Take 1 tablet by mouth 2 (two) times daily. To be taken after surgery (Patient not taking: Reported on 11/27/2020)    valACYclovir (VALTREX) 1000 MG tablet Take 1 tablet (1,000 mg total) by mouth as needed (for outbreaks.). (Patient not taking: Reported on 11/27/2020)   No facility-administered encounter medications on file as of 11/27/2020.   No Known Allergies  ROS: no fever, chills, URI symptoms, chest pain, shortness of breath, headaches, dizziness.  +pain in R knee (post-op) and R calf, though that is improved.  No n/v/d, no constipation from pain meds.  Moods are good.   PHYSICAL EXAM:  BP 140/86   Pulse 84   Ht '5\' 9"'  (1.753 m)   Wt 200 lb (90.7 kg)   BMI 29.53 kg/m   Wt Readings from Last 3 Encounters:  11/27/20 200 lb (90.7 kg)  11/13/20 195 lb (88.5 kg)  11/09/20 201 lb 3.2 oz (91.3 kg)   Well-appearing male, accompanied by his wife, in mild-mod discomfort, but sitting calmly. HEENT: conjunctiva and sclera are clear, EOMI, wearing mask Neck: no lymphadenopathy or mass Heart: regular rate and rhythm, no  murmur Lungs: clear bilaterally Abdomen: soft, nontender Extremities:  Long bandage at L knee, with some dried blood. +warmth/swelling at knee (as expected post-op). Normal pulses. Mild tenderness over medial aspect of calf just below mid-portion of calf. Slight cord palpable and tender. No warmth, no edema Psych: normal mood, affect, hygiene and grooming Neuro: alert and oriented.   ASSESSMENT/PLAN:  Acute deep vein thrombosis (DVT) of left peroneal vein (HCC) - This is a distal DVT, provoked (recent surgery).  3 mos of anticoagulation.  Will contact [place] RF of [id number] a week before finishing starter pack - Plan: OB Complications Profile  Controlled type 2 diabetes mellitus without complication, without long-term current use of insulin (HCC) - cont metformin. controlled per last check in August. Cont statin, lipids at goal  Essential hypertension - elevated today due to pain, had been normal prior to today. Cont lisinopril  Need for influenza vaccination - Plan: Flu Vaccine  QUAD 6+ mos PF IM (Fluarix Quad PF)   Will eval for any underlying cause for DVT (recurrent miscarriage panel), though suspect this is related to recent surgery (provoked, distal DVT) Treat x 3 mos Xarelto 20m30mD x 21d, then 20mg19mly. He is on starter pack. Will eventually need 20mg 66mwith 1 RF to complete the 3 mos of treatment. They will contact us whe[place]ore[place]eded (not RF the starter pack, change rx to 20mg t98mt)  Discussed that he should stop aspirin.  He should avoid all NSAIDs as well. It is fine for him to have PT start.  Flu shot given.  Discussed COVID booster--hold off today, consider in the next month (can come for NV).  Schedule CPE for 04/2021--fasting labs prior A1c, c-met, lipids, cbc, urine microalb, TSH, PSA

## 2020-11-27 ENCOUNTER — Encounter: Payer: Self-pay | Admitting: Family Medicine

## 2020-11-27 ENCOUNTER — Encounter: Payer: Self-pay | Admitting: *Deleted

## 2020-11-27 ENCOUNTER — Telehealth: Payer: Self-pay

## 2020-11-27 ENCOUNTER — Ambulatory Visit: Payer: 59 | Admitting: Family Medicine

## 2020-11-27 ENCOUNTER — Other Ambulatory Visit: Payer: Self-pay | Admitting: *Deleted

## 2020-11-27 ENCOUNTER — Other Ambulatory Visit: Payer: Self-pay

## 2020-11-27 VITALS — BP 140/86 | HR 84 | Ht 69.0 in | Wt 200.0 lb

## 2020-11-27 DIAGNOSIS — I82452 Acute embolism and thrombosis of left peroneal vein: Secondary | ICD-10-CM

## 2020-11-27 DIAGNOSIS — I10 Essential (primary) hypertension: Secondary | ICD-10-CM | POA: Diagnosis not present

## 2020-11-27 DIAGNOSIS — Z23 Encounter for immunization: Secondary | ICD-10-CM | POA: Diagnosis not present

## 2020-11-27 DIAGNOSIS — Z0000 Encounter for general adult medical examination without abnormal findings: Secondary | ICD-10-CM

## 2020-11-27 DIAGNOSIS — Z5181 Encounter for therapeutic drug level monitoring: Secondary | ICD-10-CM

## 2020-11-27 DIAGNOSIS — Z125 Encounter for screening for malignant neoplasm of prostate: Secondary | ICD-10-CM

## 2020-11-27 DIAGNOSIS — E119 Type 2 diabetes mellitus without complications: Secondary | ICD-10-CM | POA: Diagnosis not present

## 2020-11-27 MED ORDER — ONETOUCH DELICA LANCETS 33G MISC: 1.0000 | Freq: Every day | 3 refills | Status: DC

## 2020-11-27 NOTE — Telephone Encounter (Signed)
Patient would like to know if he should continue PT or wait until his visit on Wednesday, 11/29/2020?  Patient had left knee, surgery on 11/13/2020.  CB# [phone].  Please advise.  Thank you.

## 2020-11-27 NOTE — Telephone Encounter (Signed)
Yes continue PT

## 2020-11-27 NOTE — Patient Instructions (Addendum)
Please contact us when you have a week left of the starter pack of Xarelto.  We will then send in 20mg  once daily for the next 2 months, to complete a 3 month course.  While on blood thinners, please avoid any ibuprofen/motrin/aleve, naproxen/Aleve, Goody/BC powders, Excedrin, aspirin. You should only use tylenol products, if any pain medication is needed.  Please contact if you have any bleeding or problems. Use compression stocking.  Consider returning for a nurse visit in a month for the new bivalent COVID booster.

## 2020-11-28 ENCOUNTER — Encounter: Payer: Managed Care, Other (non HMO) | Admitting: Orthopaedic Surgery

## 2020-11-28 ENCOUNTER — Encounter: Payer: Self-pay | Admitting: Family Medicine

## 2020-11-28 NOTE — Telephone Encounter (Signed)
Patient aware.

## 2020-11-29 ENCOUNTER — Other Ambulatory Visit (HOSPITAL_COMMUNITY): Payer: Self-pay

## 2020-11-29 ENCOUNTER — Other Ambulatory Visit: Payer: Self-pay

## 2020-11-29 ENCOUNTER — Encounter: Payer: Self-pay | Admitting: Orthopaedic Surgery

## 2020-11-29 ENCOUNTER — Ambulatory Visit (INDEPENDENT_AMBULATORY_CARE_PROVIDER_SITE_OTHER): Payer: No Typology Code available for payment source | Admitting: Physician Assistant

## 2020-11-29 DIAGNOSIS — Z96652 Presence of left artificial knee joint: Secondary | ICD-10-CM

## 2020-11-29 MED ORDER — METHOCARBAMOL 500 MG PO TABS
500.0000 mg | ORAL_TABLET | Freq: Two times a day (BID) | ORAL | 2 refills | Status: DC | PRN
  Filled 2020-11-29: qty 30, 15d supply, fill #0
  Filled 2020-12-13: qty 30, 15d supply, fill #1
  Filled 2021-01-01: qty 30, 15d supply, fill #2

## 2020-11-29 NOTE — Progress Notes (Signed)
Post-Op Visit Note   Patient: Randall Calderon           Date of Birth: 07/02/1965           MRN: [id number] Visit Date: 11/29/2020 PCP: Avanell Shackleton, PA-C   Assessment & Plan:  Chief Complaint:  Chief Complaint  Patient presents with   Left Knee - Pain   Visit Diagnoses:  1. History of total knee replacement, left     Plan: Patient is a pleasant 55 year old gentleman who comes in today 2 weeks status post left total knee replacement 11/13/2020.  This is being filed under [employer]  He did develop a left lower extremity DVT during the acute postoperative phase.  He was prescribed a Xarelto starter pack by [place] the day he was diagnosed with DVT but was unable to start this until about a week ago due to getting this approved from [employer].  He has now been on this for an entire week.  He denies any chest pain or shortness of breath.  He does note pain to both the left knee and calf.  He is taking Percocet and Robaxin as needed.  Examination of the left knee reveals a well-healing surgical incision with nylon sutures in place.  No evidence of infection or cellulitis.  Calf is moderately tender.  No skin changes.  Mild swelling.  He does have a positive Homans.  He is neurovascular intact distally.  At this point, we will he will continue with his Xarelto starter pack.  He has already seen his PCP for follow-up from this recent DVT who will take over medication management for this.  I refilled his Robaxin.  We will provide him with an out of work note for another 4 weeks.  I have also provided the case manager with a hard copy prescription for outpatient physical therapy.  Dental prophylaxis reinforced.  Follow-up with [place] in 4 weeks time for repeat evaluation and 2 view x-rays of the left knee.  He will call us with any concerns or questions in the meantime.  Follow-Up Instructions: Return in about 4 weeks (around 12/27/2020).   Orders:  No orders of the defined types  were placed in this encounter.  Meds ordered this encounter  Medications   methocarbamol (ROBAXIN) 500 MG tablet    Sig: Take 1 tablet (500 mg total) by mouth 2 (two) times daily as needed.    Dispense:  30 tablet    Refill:  2    Imaging: No new imaging  PMFS History: Patient Active Problem List   Diagnosis Date Noted   Acute deep vein thrombosis (DVT) of left peroneal vein (HCC) 11/26/2020   Status post total left knee replacement 11/13/2020   Primary osteoarthritis of left knee 06/23/2020   Obesity (BMI 30-39.9) 05/05/2020   S/P left knee arthroscopy 04/04/2020   Acute medial meniscal tear, left, initial encounter 11/02/2019   Irritant contact dermatitis due to drug in contact with skin 04/15/2018   HSV infection 11/11/2017   Anemia, unspecified 10/29/2017   Elevated alkaline phosphatase measurement 10/29/2017   Synovitis of knee 01/18/2017   Unilateral primary osteoarthritis, right knee 12/23/2016   Controlled type 2 diabetes mellitus without complication, without long-term current use of insulin (HCC) 04/03/2016   BPPV (benign paroxysmal positional vertigo) 04/03/2016   Chronic right shoulder pain 04/03/2016   Essential hypertension 04/03/2016   Past Medical History:  Diagnosis Date   Anemia, unspecified 10/29/2017   DM type 2 (diabetes mellitus,  type 2) (HCC)    Elevated alkaline phosphatase measurement 10/29/2017   History of hemorrhoids    HSV infection 11/11/2017   HTN (hypertension)    Obesity (BMI 30.0-34.9)     Family History  Problem Relation Age of Onset   Stroke Mother        trauma (beaten with baseball bat) 29's   Cancer Mother    Throat cancer Father    Diabetes Sister    Hypertension Brother     Past Surgical History:  Procedure Laterality Date   KNEE ARTHROSCOPY WITH MENISCAL REPAIR Left 11/26/2019   Procedure: LEFT KNEE ARTHROSCOPY WITH MENISCAL ROOT REPAIR VS. DEBRIDEMENT;  Surgeon: Tarry Kos, MD;  Location: [facility];   Service: Orthopedics;  Laterality: Left;   KNEE SURGERY     18 years ago   TONSILLECTOMY     TOTAL KNEE ARTHROPLASTY Left 11/13/2020   Procedure: LEFT TOTAL KNEE ARTHROPLASTY;  Surgeon: Tarry Kos, MD;  Location: [facility] OR;  Service: Orthopedics;  Laterality: Left;   Social History   Occupational History   Not on file  Tobacco Use   Smoking status: Former    Years: 5.00    Types: Cigarettes   Smokeless tobacco: Never  Vaping Use   Vaping Use: Never used  Substance and Sexual Activity   Alcohol use: No   Drug use: No    Comment: history of heroin use and stopped 1998.    Sexual activity: Not on file

## 2020-12-03 ENCOUNTER — Other Ambulatory Visit: Payer: Self-pay | Admitting: Family Medicine

## 2020-12-04 ENCOUNTER — Other Ambulatory Visit (HOSPITAL_COMMUNITY): Payer: Self-pay

## 2020-12-04 ENCOUNTER — Other Ambulatory Visit: Payer: Self-pay | Admitting: Physician Assistant

## 2020-12-04 ENCOUNTER — Telehealth: Payer: Self-pay | Admitting: Family Medicine

## 2020-12-04 ENCOUNTER — Telehealth: Payer: Self-pay | Admitting: Orthopaedic Surgery

## 2020-12-04 MED ORDER — OXYCODONE-ACETAMINOPHEN 5-325 MG PO TABS
1.0000 | ORAL_TABLET | Freq: Three times a day (TID) | ORAL | 0 refills | Status: DC | PRN
  Filled 2020-12-04: qty 40, 7d supply, fill #0

## 2020-12-04 NOTE — Telephone Encounter (Signed)
Contacted patient and made him aware that medication has been sent into the pharmacy.

## 2020-12-04 NOTE — Telephone Encounter (Signed)
Pt is requesting a refill on Atorvastatin sent to the [facility] on [address]

## 2020-12-04 NOTE — Telephone Encounter (Signed)
Pt called asking for a refill of his oxycodone rx sent to the [facility] outpt pharmacy. He would like to be notified when this is done.   [phone]

## 2020-12-04 NOTE — Telephone Encounter (Signed)
Vickie sent rx on 10/20/20 for #90- I called [facility], it was on hold-they will fill for patient.

## 2020-12-04 NOTE — Telephone Encounter (Signed)
Sent in

## 2020-12-05 ENCOUNTER — Other Ambulatory Visit (HOSPITAL_COMMUNITY): Payer: Self-pay

## 2020-12-05 LAB — OB COMPLICATIONS PROFILE
APTT: 31.6 s — ABNORMAL HIGH
AT III Act/Nor PPP Chro: 129 %
Anticardiolipin Ab, IgA: <10 [APL'U]
Anticardiolipin Ab, IgG: <10 [GPL'U]
Anticardiolipin Ab, IgM: <10 [MPL'U]
Antiphosphatidylserine IgG: 2 {GPS'U}
Antiphosphatidylserine IgM: 0 {MPS'U}
Beta-2 Glycoprotein I, IgA: <10 SAU
Beta-2 Glycoprotein I, IgG: <10 SGU
Beta-2 Glycoprotein I, IgM: 15 SMU
DRVVT Confirm Seconds: 47.7 s
DRVVT Ratio: 1.2 ratio
DRVVT Screen Seconds: 62.4 s — ABNORMAL HIGH
Factor XIII Activity**: 126 %
Hexagonal Phospholipid Neutral: 0 s
Homocysteine: 5.7 umol/L
PAI-1 Activity: <4.4 IU/mL
Protein S Antigen, Free: 124 %
Prt C Activity (Chromogenic): 124 %

## 2020-12-06 ENCOUNTER — Other Ambulatory Visit (HOSPITAL_COMMUNITY): Payer: Self-pay

## 2020-12-06 MED FILL — Atorvastatin Calcium Tab 40 MG (Base Equivalent): ORAL | 90 days supply | Qty: 90 | Fill #0 | Status: AC

## 2020-12-13 ENCOUNTER — Other Ambulatory Visit (HOSPITAL_COMMUNITY): Payer: Self-pay

## 2020-12-15 ENCOUNTER — Other Ambulatory Visit (HOSPITAL_COMMUNITY): Payer: Self-pay

## 2020-12-15 ENCOUNTER — Telehealth: Payer: Self-pay

## 2020-12-15 MED ORDER — RIVAROXABAN 20 MG PO TABS
20.0000 mg | ORAL_TABLET | Freq: Every day | ORAL | 1 refills | Status: DC
  Filled 2020-12-15 (×2): qty 30, 30d supply, fill #0
  Filled 2021-01-15: qty 30, 30d supply, fill #1

## 2020-12-15 NOTE — Telephone Encounter (Signed)
Rx sent 

## 2020-12-15 NOTE — Telephone Encounter (Signed)
Patient called stating he was instructed to call when he was due for xarelto refill he states he will need some soon but has enough to last until Monday when Dr.Knapp is in office.  New rx to be sent to [facility] pharmacy

## 2020-12-20 ENCOUNTER — Telehealth: Payer: Self-pay | Admitting: Orthopaedic Surgery

## 2020-12-20 ENCOUNTER — Other Ambulatory Visit: Payer: Self-pay | Admitting: Physician Assistant

## 2020-12-20 ENCOUNTER — Other Ambulatory Visit (HOSPITAL_COMMUNITY): Payer: Self-pay

## 2020-12-20 MED ORDER — HYDROCODONE-ACETAMINOPHEN 5-325 MG PO TABS
1.0000 | ORAL_TABLET | Freq: Three times a day (TID) | ORAL | 0 refills | Status: DC | PRN
  Filled 2020-12-20: qty 30, 5d supply, fill #0

## 2020-12-20 NOTE — Telephone Encounter (Signed)
Pt called requesting a refill of oxycodone. Please sed to pharmacy on file. Pt phone number is [phone].

## 2020-12-20 NOTE — Telephone Encounter (Signed)
Weaning to norco and I just sent in

## 2020-12-28 ENCOUNTER — Ambulatory Visit (INDEPENDENT_AMBULATORY_CARE_PROVIDER_SITE_OTHER): Payer: No Typology Code available for payment source | Admitting: Orthopaedic Surgery

## 2020-12-28 ENCOUNTER — Encounter: Payer: Self-pay | Admitting: Orthopaedic Surgery

## 2020-12-28 ENCOUNTER — Other Ambulatory Visit: Payer: Self-pay

## 2020-12-28 ENCOUNTER — Ambulatory Visit: Payer: Self-pay

## 2020-12-28 DIAGNOSIS — M25562 Pain in left knee: Secondary | ICD-10-CM

## 2020-12-28 DIAGNOSIS — Z96652 Presence of left artificial knee joint: Secondary | ICD-10-CM

## 2020-12-28 NOTE — Progress Notes (Signed)
Post-Op Visit Note   Patient: Randall Calderon           Date of Birth: 1965/12/23           MRN: [id number] Visit Date: 12/28/2020 PCP: Avanell Shackleton, PA-C   Assessment & Plan:  Chief Complaint:  Chief Complaint  Patient presents with   Left Knee - Follow-up    Left total knee arthroplasty 11/13/2020   Visit Diagnoses:  1. History of total knee replacement, left     Plan: Mr. Zeitz is 6 weeks status post left total knee replacement.  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  He is doing physical [facility] twice a week at [facility].  Currently using a walker.  He takes oxycodone and Tylenol as needed.  He is actually been terminated by his employer.  Case manager present today.  Overall he does not have any real complaints regarding his recovery.  Left knee shows a fully healed surgical scar.  Range of motion is 8 to 115 degrees.  No varus valgus instability.  Mild swelling and edema.  The x-rays show stable total knee replacement without any complications.  From my standpoint Mr. Pendelton is doing well.  Would like to put a heel lift in his other shoe to help promote extension to the operative extremity.  Out of work note for 6 weeks.  Continue outpatient PT for strengthening, range of motion, and gait training.  I did tell him that he needs to find follow-up with medical doctor to determine how long he needs to be on Xarelto for the DVT.  Nurse case manager and wife were both present today.  Follow-Up Instructions: Return in about 6 weeks (around 02/08/2021).   Orders:  Orders Placed This Encounter  Procedures   XR Knee 1-2 Views Left   No orders of the defined types were placed in this encounter.   Imaging: XR Knee 1-2 Views Left  Result Date: 12/28/2020 Stable total knee replacement in good alignment.    PMFS History: Patient Active Problem List   Diagnosis Date Noted   Acute deep vein thrombosis (DVT) of left peroneal vein (HCC) 11/26/2020   Status post  total left knee replacement 11/13/2020   Primary osteoarthritis of left knee 06/23/2020   Obesity (BMI 30-39.9) 05/05/2020   S/P left knee arthroscopy 04/04/2020   Acute medial meniscal tear, left, initial encounter 11/02/2019   Irritant contact dermatitis due to drug in contact with skin 04/15/2018   HSV infection 11/11/2017   Anemia, unspecified 10/29/2017   Elevated alkaline phosphatase measurement 10/29/2017   Synovitis of knee 01/18/2017   Unilateral primary osteoarthritis, right knee 12/23/2016   Controlled type 2 diabetes mellitus without complication, without long-term current use of insulin (HCC) 04/03/2016   BPPV (benign paroxysmal positional vertigo) 04/03/2016   Chronic right shoulder pain 04/03/2016   Essential hypertension 04/03/2016   Past Medical History:  Diagnosis Date   Anemia, unspecified 10/29/2017   DM type 2 (diabetes mellitus, type 2) (HCC)    Elevated alkaline phosphatase measurement 10/29/2017   History of hemorrhoids    HSV infection 11/11/2017   HTN (hypertension)    Obesity (BMI 30.0-34.9)     Family History  Problem Relation Age of Onset   Stroke Mother        trauma (beaten with baseball bat) 6's   Cancer Mother    Throat cancer Father    Diabetes Sister    Hypertension Brother     Past Surgical History:  Procedure Laterality Date   KNEE ARTHROSCOPY WITH MENISCAL REPAIR Left 11/26/2019   Procedure: LEFT KNEE ARTHROSCOPY WITH MENISCAL ROOT REPAIR VS. DEBRIDEMENT;  Surgeon: Tarry Kos, MD;  Location: [facility];  Service: Orthopedics;  Laterality: Left;   KNEE SURGERY     18 years ago   TONSILLECTOMY     TOTAL KNEE ARTHROPLASTY Left 11/13/2020   Procedure: LEFT TOTAL KNEE ARTHROPLASTY;  Surgeon: Tarry Kos, MD;  Location: [facility] OR;  Service: Orthopedics;  Laterality: Left;   Social History   Occupational History   Not on file  Tobacco Use   Smoking status: Former    Years: 5.00    Types: Cigarettes   Smokeless tobacco:  Never  Vaping Use   Vaping Use: Never used  Substance and Sexual Activity   Alcohol use: No   Drug use: No    Comment: history of heroin use and stopped 1998.    Sexual activity: Not on file

## 2021-01-01 ENCOUNTER — Other Ambulatory Visit (HOSPITAL_COMMUNITY): Payer: Self-pay

## 2021-01-08 ENCOUNTER — Encounter: Payer: Self-pay | Admitting: Orthopaedic Surgery

## 2021-01-08 ENCOUNTER — Other Ambulatory Visit: Payer: Self-pay | Admitting: Physician Assistant

## 2021-01-08 NOTE — Telephone Encounter (Signed)
Randall Calderon, can you provide him with a script for a cane please.  Thanks.

## 2021-01-09 DIAGNOSIS — H5203 Hypermetropia, bilateral: Secondary | ICD-10-CM | POA: Diagnosis not present

## 2021-01-09 DIAGNOSIS — H43393 Other vitreous opacities, bilateral: Secondary | ICD-10-CM | POA: Diagnosis not present

## 2021-01-09 DIAGNOSIS — H2513 Age-related nuclear cataract, bilateral: Secondary | ICD-10-CM | POA: Diagnosis not present

## 2021-01-09 LAB — HM DIABETES EYE EXAM

## 2021-01-10 ENCOUNTER — Other Ambulatory Visit: Payer: Self-pay | Admitting: Physician Assistant

## 2021-01-10 ENCOUNTER — Other Ambulatory Visit (HOSPITAL_COMMUNITY): Payer: Self-pay

## 2021-01-15 ENCOUNTER — Other Ambulatory Visit (HOSPITAL_COMMUNITY): Payer: Self-pay

## 2021-01-16 ENCOUNTER — Telehealth: Payer: Self-pay | Admitting: Orthopaedic Surgery

## 2021-01-16 ENCOUNTER — Other Ambulatory Visit: Payer: Self-pay | Admitting: Physician Assistant

## 2021-01-16 ENCOUNTER — Other Ambulatory Visit (HOSPITAL_COMMUNITY): Payer: Self-pay

## 2021-01-16 MED ORDER — HYDROCODONE-ACETAMINOPHEN 5-325 MG PO TABS
1.0000 | ORAL_TABLET | Freq: Two times a day (BID) | ORAL | 0 refills | Status: DC | PRN
  Filled 2021-01-16: qty 20, 10d supply, fill #0

## 2021-01-16 NOTE — Telephone Encounter (Signed)
Weaning to norco.  Just sent in

## 2021-01-16 NOTE — Telephone Encounter (Signed)
Pt called asking to have a refill of his percocet 5-325 mg rx; [facility] it [facility] but they didn't get anything. Pt would like a CB when this has been done please.   [phone]

## 2021-01-16 NOTE — Telephone Encounter (Signed)
Patient aware.

## 2021-01-18 ENCOUNTER — Other Ambulatory Visit: Payer: Self-pay

## 2021-01-18 MED ORDER — LISINOPRIL 10 MG PO TABS: 10.0000 mg | ORAL_TABLET | Freq: Every day | ORAL | 0 refills | Status: DC

## 2021-01-25 ENCOUNTER — Ambulatory Visit: Payer: 59 | Admitting: Family Medicine

## 2021-02-07 ENCOUNTER — Encounter: Payer: Self-pay | Admitting: Orthopaedic Surgery

## 2021-02-07 ENCOUNTER — Other Ambulatory Visit: Payer: Self-pay

## 2021-02-07 ENCOUNTER — Other Ambulatory Visit (HOSPITAL_COMMUNITY): Payer: Self-pay

## 2021-02-07 ENCOUNTER — Ambulatory Visit (INDEPENDENT_AMBULATORY_CARE_PROVIDER_SITE_OTHER): Payer: No Typology Code available for payment source | Admitting: Physician Assistant

## 2021-02-07 DIAGNOSIS — Z96652 Presence of left artificial knee joint: Secondary | ICD-10-CM

## 2021-02-07 MED ORDER — METHOCARBAMOL 500 MG PO TABS
500.0000 mg | ORAL_TABLET | Freq: Two times a day (BID) | ORAL | 0 refills | Status: DC | PRN
  Filled 2021-02-07: qty 20, 10d supply, fill #0

## 2021-02-07 NOTE — Progress Notes (Deleted)
Post-Op Visit Note   Patient: Randall Calderon           Date of Birth: 1965-07-25           MRN: [id number] Visit Date: 02/07/2021 PCP: Avanell Shackleton, PA-C   Assessment & Plan:  Chief Complaint:  Chief Complaint  Patient presents with   Left Knee - Follow-up   Visit Diagnoses:  1. History of total knee replacement, left     Plan: Patient is a pleasant 55 year old gentleman who comes in today 3 months status post left total knee replacement, date of surgery 11/13/2020.  He has been doing relatively well.  He has been in physical therapy making good progress with range of motion.  He still endorses pain with flexion of the knee and limitations with strength.  He did develop a postoperative DVT and is on Xarelto.  Currently being managed by primary care.  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  Examination of the left knee reveals a fully healed surgical scar without complication.  Range of motion 0 to 120 degrees.  He is neurovascular intact distally.  At this point, he would like to continue with physical therapy to work on strength.  A new prescription was provided to our [facility] department.  Work note provided for sedentary duty for the next 3 months.  He will follow-up with [place] in 3 months time for repeat evaluation and 2 view x-rays of the left knee.  Call with concerns or questions in the meantime.  Follow-Up Instructions: Return in about 3 months (around 05/08/2021).   Orders:  No orders of the defined types were placed in this encounter.  Meds ordered this encounter  Medications   methocarbamol (ROBAXIN) 500 MG tablet    Sig: Take 1 tablet (500 mg total) by mouth 2 (two) times daily as needed.    Dispense:  20 tablet    Refill:  0    Imaging: No new imaging  PMFS History: Patient Active Problem List   Diagnosis Date Noted   Acute deep vein thrombosis (DVT) of left peroneal vein (HCC) 11/26/2020    Status post total left knee replacement 11/13/2020   Primary osteoarthritis of left knee 06/23/2020   Obesity (BMI 30-39.9) 05/05/2020   S/P left knee arthroscopy 04/04/2020   Acute medial meniscal tear, left, initial encounter 11/02/2019   Irritant contact dermatitis due to drug in contact with skin 04/15/2018   HSV infection 11/11/2017   Anemia, unspecified 10/29/2017   Elevated alkaline phosphatase measurement 10/29/2017   Synovitis of knee 01/18/2017   Unilateral primary osteoarthritis, right knee 12/23/2016   Controlled type 2 diabetes mellitus without complication, without long-term current use of insulin (HCC) 04/03/2016   BPPV (benign paroxysmal positional vertigo) 04/03/2016   Chronic right shoulder pain 04/03/2016   Essential hypertension 04/03/2016   Past Medical History:  Diagnosis Date   Anemia, unspecified 10/29/2017   DM type 2 (diabetes mellitus, type 2) (HCC)    Elevated alkaline phosphatase measurement 10/29/2017   History of hemorrhoids    HSV infection 11/11/2017   HTN (hypertension)    Obesity (BMI 30.0-34.9)     Family History  Problem Relation Age of Onset   Stroke Mother        trauma (beaten with baseball bat) 40's   Cancer Mother    Throat cancer Father    Diabetes Sister    Hypertension Brother  Past Surgical History:  °Procedure Laterality Date  ° KNEE ARTHROSCOPY WITH MENISCAL REPAIR Left 11/26/2019  ° Procedure: LEFT KNEE ARTHROSCOPY WITH MENISCAL ROOT REPAIR VS. DEBRIDEMENT;  Surgeon: Xu, Naiping M, MD;  Location: [facility];  Service: Orthopedics;  Laterality: Left;  ° KNEE SURGERY    ° 18 years ago  ° TONSILLECTOMY    ° TOTAL KNEE ARTHROPLASTY Left 11/13/2020  ° Procedure: LEFT TOTAL KNEE ARTHROPLASTY;  Surgeon: Xu, Naiping M, MD;  Location: [facility] OR;  Service: Orthopedics;  Laterality: Left;  ° °Social History  ° °Occupational History  ° Not on file  °Tobacco Use  ° Smoking status: Former  °  Years: 5.00  °  Types:  Cigarettes  ° Smokeless tobacco: Never  °Vaping Use  ° Vaping Use: Never used  °Substance and Sexual Activity  ° Alcohol use: No  ° Drug use: No  °  Comment: history of heroin use and stopped 1998.   ° Sexual activity: Not on file  ° ° ° °

## 2021-02-07 NOTE — Progress Notes (Signed)
Post-Op Visit Note   Patient: Randall Calderon           Date of Birth: 1965/03/22           MRN: [id number] Visit Date: 02/07/2021 PCP: Avanell Shackleton, PA-C   Assessment & Plan:  Chief Complaint:  Chief Complaint  Patient presents with   Left Knee - Follow-up   Visit Diagnoses:  1. History of total knee replacement, left     Plan: Patient is a pleasant 55 year old gentleman who comes in today 3 months status post left total knee replacement, date of surgery 11/13/2020.  He has been doing relatively well.  He has been in physical therapy making good progress with range of motion.  He still endorses pain with flexion of the knee and limitations with strength.  He did develop a postoperative DVT and is on Xarelto.  Currently being managed by primary care.  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  Examination of the left knee reveals a fully healed surgical scar without complication.  Range of motion 0 to 120 degrees.  He is neurovascular intact distally.  At this point, he would like to continue with physical therapy to work on strength.  A new prescription was provided to our [facility] department.  Work note provided for sedentary duty for the next 3 months.  He will follow-up with [place] in 3 months time for repeat evaluation and 2 view x-rays of the left knee.  Call with concerns or questions in the meantime.   Follow-Up Instructions: Return in about 3 months (around 05/08/2021).   Orders:  No orders of the defined types were placed in this encounter.  Meds ordered this encounter  Medications   methocarbamol (ROBAXIN) 500 MG tablet    Sig: Take 1 tablet (500 mg total) by mouth 2 (two) times daily as needed.    Dispense:  20 tablet    Refill:  0    Imaging: No new imaging  PMFS History: Patient Active Problem List   Diagnosis Date Noted   Acute deep vein thrombosis (DVT) of left peroneal vein (HCC)  11/26/2020   Status post total left knee replacement 11/13/2020   Primary osteoarthritis of left knee 06/23/2020   Obesity (BMI 30-39.9) 05/05/2020   S/P left knee arthroscopy 04/04/2020   Acute medial meniscal tear, left, initial encounter 11/02/2019   Irritant contact dermatitis due to drug in contact with skin 04/15/2018   HSV infection 11/11/2017   Anemia, unspecified 10/29/2017   Elevated alkaline phosphatase measurement 10/29/2017   Synovitis of knee 01/18/2017   Unilateral primary osteoarthritis, right knee 12/23/2016   Controlled type 2 diabetes mellitus without complication, without long-term current use of insulin (HCC) 04/03/2016   BPPV (benign paroxysmal positional vertigo) 04/03/2016   Chronic right shoulder pain 04/03/2016   Essential hypertension 04/03/2016   Past Medical History:  Diagnosis Date   Anemia, unspecified 10/29/2017   DM type 2 (diabetes mellitus, type 2) (HCC)    Elevated alkaline phosphatase measurement 10/29/2017   History of hemorrhoids    HSV infection 11/11/2017   HTN (hypertension)    Obesity (BMI 30.0-34.9)     Family History  Problem Relation Age of Onset   Stroke Mother        trauma (beaten with baseball bat) 60's   Cancer Mother    Throat cancer Father    Diabetes Sister    Hypertension Brother  Past Surgical History:  Procedure Laterality Date   KNEE ARTHROSCOPY WITH MENISCAL REPAIR Left 11/26/2019   Procedure: LEFT KNEE ARTHROSCOPY WITH MENISCAL ROOT REPAIR VS. DEBRIDEMENT;  Surgeon: Tarry Kos, MD;  Location: [facility];  Service: Orthopedics;  Laterality: Left;   KNEE SURGERY     18 years ago   TONSILLECTOMY     TOTAL KNEE ARTHROPLASTY Left 11/13/2020   Procedure: LEFT TOTAL KNEE ARTHROPLASTY;  Surgeon: Tarry Kos, MD;  Location: [facility];  Service: Orthopedics;  Laterality: Left;   Social History   Occupational History   Not on file  Tobacco Use   Smoking status: Former    Years: 5.00    Types:  Cigarettes   Smokeless tobacco: Never  Vaping Use   Vaping Use: Never used  Substance and Sexual Activity   Alcohol use: No   Drug use: No    Comment: history of heroin use and stopped 1998.    Sexual activity: Not on file

## 2021-02-09 ENCOUNTER — Encounter: Payer: Self-pay | Admitting: Physician Assistant

## 2021-02-13 ENCOUNTER — Telehealth: Payer: Self-pay | Admitting: Family Medicine

## 2021-02-13 ENCOUNTER — Telehealth: Payer: Self-pay | Admitting: Orthopaedic Surgery

## 2021-02-13 NOTE — Telephone Encounter (Signed)
Pt called and is requesting  a refill on his metformin Pt is using a new pharmacy [facility]

## 2021-02-13 NOTE — Telephone Encounter (Signed)
Pt was advised it will be filled tomorrow at his appointment since he had enough to last until then. KH

## 2021-02-13 NOTE — Telephone Encounter (Signed)
Patient called asked if he can get an antibiotic sent to his pharmacy.    Patient said he has an dental appointment on 02/22/2021.   Patient uses [facility]. The number to contact patient is [phone]

## 2021-02-13 NOTE — Progress Notes (Signed)
Chief Complaint  Patient presents with   Follow-up    Patient is here for follow on DVT. He has 3 metformin tablets left and 1 week of lisinopril. States he has couple weeks of atorvastatin left. He states he just wants to know that the blood clot is gone so he can get back to doing more intensive PT-they are limiting what he can do right now.     Patient presents for f/u on DVT.  He had L peroneal vein DVT on 11/21/20.  He has been on Xarelto since then. He is healing well from knee replacement. He denies any pain in calf, swelling.  No longer has any pain in calf.  He has pain L lateral knee and behind his knee.  He is getting PT, but they apparently are limiting what they are doing due to his h/o blood clot.  Asking for clearance for full activity. Denies any swelling in the calf.  DM--checks sugars once or twice a week, seeing 108-112. Takes metformin only once daily (and reports has been doing so for quite a while). He needs refills per chief complaint above. He is scheduled for labs and med check in March.  PMH, PSH, SH reviewed  Outpatient Encounter Medications as of 02/14/2021  Medication Sig Note   acetaminophen (TYLENOL) 325 MG tablet Take 1,300 mg by mouth every 6 (six) hours as needed. 02/14/2021: Last dose Monday   FIBER PO Take 1 tablet by mouth daily.    Glucose Blood (BLOOD GLUCOSE TEST STRIPS) STRP Test once daily. Pt uses one touch verio flex meter    metFORMIN (GLUCOPHAGE-XR) 500 MG 24 hr tablet Take 1 tablet (500 mg total) by mouth daily with breakfast.    methocarbamol (ROBAXIN) 500 MG tablet Take 1 tablet (500 mg total) by mouth 2 (two) times daily as needed.    Multiple Vitamin (MULTIVITAMIN WITH MINERALS) TABS tablet Take 1 tablet by mouth daily.    OneTouch Delica Lancets 33G MISC 1 each by Does not apply route daily. One Touch Delica Plus Lancets, patient uses One Touch Verio Flex Meter    rivaroxaban (XARELTO) 20 MG TABS tablet Take 1 tablet (20 mg total) by mouth  daily with supper.    [DISCONTINUED] atorvastatin (LIPITOR) 40 MG tablet Take 1 tablet (40 mg total) by mouth daily.    [DISCONTINUED] lisinopril (ZESTRIL) 10 MG tablet Take 1 tablet (10 mg total) by mouth daily.    [DISCONTINUED] metFORMIN (GLUCOPHAGE) 500 MG tablet TAKE 1 TABLET BY MOUTH TWICE DAILY WITH A MEAL (Patient taking differently: Take 500 mg by mouth daily with breakfast.)    amoxicillin (AMOXIL) 500 MG capsule Take 4 capsules (2,000 mg total) by mouth 1 hour prior to dental work (Patient not taking: Reported on 02/14/2021) 02/14/2021: Uses prior to dental work   atorvastatin (LIPITOR) 40 MG tablet Take 1 tablet (40 mg total) by mouth daily.    lisinopril (ZESTRIL) 10 MG tablet Take 1 tablet (10 mg total) by mouth daily.    valACYclovir (VALTREX) 1000 MG tablet Take 1 tablet (1,000 mg total) by mouth as needed (for outbreaks.). (Patient not taking: Reported on 02/14/2021) 02/14/2021: Last used one week ago   [DISCONTINUED] aspirin EC 81 MG tablet Take 1 tablet (81 mg total) by mouth 2 (two) times daily. To be taken after surgery    [DISCONTINUED] docusate sodium (COLACE) 100 MG capsule Take 1 capsule (100 mg total) by mouth daily as needed.    [DISCONTINUED] HYDROcodone-acetaminophen (NORCO) 5-325 MG tablet  Take 1 tablet by mouth 2 (two) times daily as needed.    [DISCONTINUED] ondansetron (ZOFRAN) 4 MG tablet Take 1 tablet (4 mg total) by mouth every 8 (eight) hours as needed for nausea or vomiting.    [DISCONTINUED] oxyCODONE-acetaminophen (PERCOCET) 5-325 MG tablet Take 1-2 tablets by mouth every 8 (eight) hours as needed. To be taken after surgery.    [DISCONTINUED] sulfamethoxazole-trimethoprim (BACTRIM DS) 800-160 MG tablet Take 1 tablet by mouth 2 (two) times daily. To be taken after surgery    No facility-administered encounter medications on file as of 02/14/2021.   NOT taking the metformin ER prior to today  No Known Allergies  ROS: Denies fever, chills, URI symptoms, headaches,  dizziness, shortness of breath, chest pain.  Denies nausea, vomiting, bowel changes, urinary complaints, bleeding, bruising, rash. See HPI   PHYSICAL EXAM:  BP 132/80    Pulse 68    Ht 5\' 9"  (1.753 m)    Wt 200 lb 3.2 oz (90.8 kg)    BMI 29.56 kg/m   Wt Readings from Last 3 Encounters:  02/14/21 200 lb 3.2 oz (90.8 kg)  11/27/20 200 lb (90.7 kg)  11/13/20 195 lb (88.5 kg)    Pleasant, well-appearing male in no distress. Using cane with ambulation Neck: no lymphadenopathy or mass Heart: regular rate and rhythm Lungs: clear bilaterally Extremities:  WHSS at L knee. Some warmth and effusion, nontender Calf is nontender, no cords. Normal distal pulses   ASSESSMENT/PLAN:  History of DVT (deep vein thrombosis) - clinically resolved. To complete the last week of the 3 months of anticoagulation.  Released for full PT  Essential hypertension - controlled - Plan: lisinopril (ZESTRIL) 10 MG tablet  Controlled type 2 diabetes mellitus without complication, without long-term current use of insulin (HCC) - since only taking metformin once daily, change to ER. f/u as scheduled in March for labs, visit - Plan: atorvastatin (LIPITOR) 40 MG tablet, metFORMIN (GLUCOPHAGE-XR) 500 MG 24 hr tablet  F/u as planned for CPE and med check with labs prior in March.  He states he never picked up the lisinopril at [facility].  Uses [facility] outpatient pharmacy. RF sent to [facility]. Atorvastatin #90 to last until next visit/labs. Metformin--only taking 1/d--Change to ER #90  Anticoagulation x 3 months for distal DVT, through 02/21/21.

## 2021-02-14 ENCOUNTER — Ambulatory Visit: Payer: 59 | Admitting: Family Medicine

## 2021-02-14 ENCOUNTER — Other Ambulatory Visit: Payer: Self-pay | Admitting: Physician Assistant

## 2021-02-14 ENCOUNTER — Other Ambulatory Visit (HOSPITAL_COMMUNITY): Payer: Self-pay

## 2021-02-14 ENCOUNTER — Other Ambulatory Visit: Payer: Self-pay

## 2021-02-14 ENCOUNTER — Encounter: Payer: Self-pay | Admitting: Family Medicine

## 2021-02-14 VITALS — BP 132/80 | HR 68 | Ht 69.0 in | Wt 200.2 lb

## 2021-02-14 DIAGNOSIS — E119 Type 2 diabetes mellitus without complications: Secondary | ICD-10-CM | POA: Diagnosis not present

## 2021-02-14 DIAGNOSIS — Z86718 Personal history of other venous thrombosis and embolism: Secondary | ICD-10-CM

## 2021-02-14 DIAGNOSIS — I10 Essential (primary) hypertension: Secondary | ICD-10-CM

## 2021-02-14 MED ORDER — ATORVASTATIN CALCIUM 40 MG PO TABS
40.0000 mg | ORAL_TABLET | Freq: Every day | ORAL | 0 refills | Status: DC
  Filled 2021-02-14 – 2021-03-14 (×2): qty 90, 90d supply, fill #0

## 2021-02-14 MED ORDER — LISINOPRIL 10 MG PO TABS
10.0000 mg | ORAL_TABLET | Freq: Every day | ORAL | 0 refills | Status: DC
  Filled 2021-02-14: qty 90, 90d supply, fill #0

## 2021-02-14 MED ORDER — AMOXICILLIN 500 MG PO CAPS
2000.0000 mg | ORAL_CAPSULE | ORAL | 2 refills | Status: DC
  Filled 2021-02-14: qty 8, 2d supply, fill #0

## 2021-02-14 MED ORDER — METFORMIN HCL ER 500 MG PO TB24
500.0000 mg | ORAL_TABLET | Freq: Every day | ORAL | 0 refills | Status: DC
  Filled 2021-02-14: qty 90, 90d supply, fill #0

## 2021-02-14 NOTE — Telephone Encounter (Signed)
Sent mychart message

## 2021-02-14 NOTE — Telephone Encounter (Signed)
Sent in

## 2021-02-14 NOTE — Patient Instructions (Signed)
There is no clinical evidence of any ongoing DVT. Complete the full 3 months of Xarelto (should be until 02/21/21). You may participate in physical therapy without any restrictions related to prior DVT.  We are changing your metformin to an extended release, since you have only been taking it once daily.  Periodically check sugar later in the day (2 hours after a meal or at bedtime), as well as morning (fasting) sugars.

## 2021-03-14 ENCOUNTER — Other Ambulatory Visit (HOSPITAL_COMMUNITY): Payer: Self-pay

## 2021-04-26 ENCOUNTER — Other Ambulatory Visit: Payer: 59

## 2021-04-26 ENCOUNTER — Other Ambulatory Visit: Payer: Self-pay

## 2021-04-26 DIAGNOSIS — Z125 Encounter for screening for malignant neoplasm of prostate: Secondary | ICD-10-CM | POA: Diagnosis not present

## 2021-04-26 DIAGNOSIS — Z5181 Encounter for therapeutic drug level monitoring: Secondary | ICD-10-CM | POA: Diagnosis not present

## 2021-04-26 DIAGNOSIS — E119 Type 2 diabetes mellitus without complications: Secondary | ICD-10-CM | POA: Diagnosis not present

## 2021-04-26 DIAGNOSIS — Z0000 Encounter for general adult medical examination without abnormal findings: Secondary | ICD-10-CM | POA: Diagnosis not present

## 2021-04-27 LAB — COMPREHENSIVE METABOLIC PANEL
ALT: 19 IU/L (ref 0–44)
AST: 20 IU/L (ref 0–40)
Albumin/Globulin Ratio: 1.8 (ref 1.2–2.2)
Albumin: 4.8 g/dL (ref 3.8–4.9)
Alkaline Phosphatase: 196 IU/L — ABNORMAL HIGH (ref 44–121)
BUN/Creatinine Ratio: 10 (ref 9–20)
BUN: 12 mg/dL (ref 6–24)
Bilirubin Total: 0.4 mg/dL (ref 0.0–1.2)
CO2: 24 mmol/L (ref 20–29)
Calcium: 10 mg/dL (ref 8.7–10.2)
Chloride: 101 mmol/L (ref 96–106)
Creatinine, Ser: 1.16 mg/dL (ref 0.76–1.27)
Globulin, Total: 2.7 g/dL (ref 1.5–4.5)
Glucose: 118 mg/dL — ABNORMAL HIGH (ref 70–99)
Potassium: 4.6 mmol/L (ref 3.5–5.2)
Sodium: 138 mmol/L (ref 134–144)
Total Protein: 7.5 g/dL (ref 6.0–8.5)
eGFR: 73 mL/min/{1.73_m2} (ref >59)

## 2021-04-27 LAB — CBC WITH DIFFERENTIAL/PLATELET
Basophils Absolute: 0 10*3/uL (ref 0.0–0.2)
Basos: 1 %
EOS (ABSOLUTE): 0.2 10*3/uL (ref 0.0–0.4)
Eos: 4 %
Hematocrit: 39.4 % (ref 37.5–51.0)
Hemoglobin: 13.4 g/dL (ref 13.0–17.7)
Immature Grans (Abs): 0 10*3/uL (ref 0.0–0.1)
Immature Granulocytes: 0 %
Lymphocytes Absolute: 2.7 10*3/uL (ref 0.7–3.1)
Lymphs: 58 %
MCH: 28.9 pg (ref 26.6–33.0)
MCHC: 34 g/dL (ref 31.5–35.7)
MCV: 85 fL (ref 79–97)
Monocytes Absolute: 0.4 10*3/uL (ref 0.1–0.9)
Monocytes: 8 %
Neutrophils Absolute: 1.3 10*3/uL — ABNORMAL LOW (ref 1.4–7.0)
Neutrophils: 29 %
Platelets: 217 10*3/uL (ref 150–450)
RBC: 4.63 x10E6/uL (ref 4.14–5.80)
RDW: 13.1 % (ref 11.6–15.4)
WBC: 4.6 10*3/uL (ref 3.4–10.8)

## 2021-04-27 LAB — LIPID PANEL
Chol/HDL Ratio: 2.6 ratio (ref 0.0–5.0)
Cholesterol, Total: 120 mg/dL (ref 100–199)
HDL: 46 mg/dL (ref >39)
LDL Chol Calc (NIH): 61 mg/dL (ref 0–99)
Triglycerides: 62 mg/dL (ref 0–149)
VLDL Cholesterol Cal: 13 mg/dL (ref 5–40)

## 2021-04-27 LAB — MICROALBUMIN / CREATININE URINE RATIO
Creatinine, Urine: 185.9 mg/dL
Microalb/Creat Ratio: 3 mg/g creat (ref 0–29)
Microalbumin, Urine: 5.8 ug/mL

## 2021-04-27 LAB — HEMOGLOBIN A1C
Est. average glucose Bld gHb Est-mCnc: 140 mg/dL
Hgb A1c MFr Bld: 6.5 % — ABNORMAL HIGH (ref 4.8–5.6)

## 2021-04-27 LAB — PSA: Prostate Specific Ag, Serum: 0.8 ng/mL (ref 0.0–4.0)

## 2021-04-27 LAB — TSH: TSH: 1.53 u[IU]/mL (ref 0.450–4.500)

## 2021-04-29 NOTE — Patient Instructions (Addendum)
?  HEALTH MAINTENANCE RECOMMENDATIONS: ? ?It is recommended that you get at least 30 minutes of aerobic exercise at least 5 days/week (for weight loss, you may need as much as 60-90 minutes). This can be any activity that gets your heart rate up. This can be divided in 10-15 minute intervals if needed, but try and build up your endurance at least once a week.  Weight bearing exercise is also recommended twice weekly. ? ?Eat a healthy diet with lots of vegetables, fruits and fiber.  "Colorful" foods have a lot of vitamins (ie green vegetables, tomatoes, red peppers, etc).  Limit sweet tea, regular sodas and alcoholic beverages, all of which has a lot of calories and sugar.  Up to 2 alcoholic drinks daily may be beneficial for men (unless trying to lose weight, watch sugars).  Drink a lot of water. ? ?Sunscreen of at least SPF 30 should be used on all sun-exposed parts of the skin when outside between the hours of 10 am and 4 pm (not just when at beach or pool, but even with exercise, golf, tennis, and yard work!)  Use a sunscreen that says "broad spectrum" so it covers both UVA and UVB rays, and make sure to reapply every 1-2 hours. ? ?Remember to change the batteries in your smoke detectors when changing your clock times in the spring and fall.  Carbon monoxide detectors are recommended for your home. ? ?Use your seat belt every time you are in a car, and please drive safely and not be distracted with cell phones and texting while driving. ? ? ?Please remember to get diabetic eye exams once yearly (and ask them to forward [place] a copy of the note). ? ?I recommend getting the new shingles vaccine (Shingrix). ?It sounds like you only had Zostavax (the live attenuated vaccine, which is only 50% effective in preventing shingles). ? ?Shingrix is a newer vaccine, which is more effective.  You may want to check with your insurance to verify what your out of pocket cost may be (usually covered as preventative, but better to  verify to avoid any surprises, as this vaccine is expensive), and then schedule a nurse visit at our office when convenient (based on the possible side effects as discussed).   ?This is a series of 2 injections, spaced 2 months apart.  It doesn't have to be exactly 2 months apart (but can't be sooner), if that isn't feasible for your schedule, but try and get them close to 2 months (and definitely within 6 months of each other, or else the efficacy of the vaccine drops off). ?This should be separated from other vaccines by at least 2 weeks. ? ?I encourage you to get the bivalent COVID booster. ? ?Consider bringing your wrist blood pressure monitor to have the accuracy assessed.  You can do this at a nurse visit, or bring it to your next appointment. ? ?High fiber diet, adequate water intake are important to help prevent constipation and hemorrhoid flares.  ?

## 2021-04-29 NOTE — Progress Notes (Signed)
Chief Complaint  ?Patient presents with  ? Annual Exam  ?  Nonfasting annual exam, already had labs. Goes to [facility] and believes he had diabetic eye exam in 2022-I will request. Patient does not think he has had Shingrix and does not want. I looked back at records and it appears he only had the one "live" shingles vaccine at his request and at that time it would have been Zostavax which was a frozen live vaccine. He has no concerns at this time.   ? ? ?Randall Calderon is a 56 y.o. male who presents for a complete physical and med check on chronic problems. ?See below for labs done prior to his visit. ?He has the following concerns: ? ?He has a hemorrhoid problem.  He notices some BRB on the toilet paper with wiping, feels them bulging.  Bowels are moving regularly, no constipation, no straining.  Flaring is very sporadic and short-lived.  OTC hemorrhoid cream is effective. ? 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  He completed 3 months treatment with Xarelto and denies any further pain/swelling in LE.  ?He describes a "weird chill" in the left leg. Both sides, along the medial calf to the foot.  Still has some numbness in the L heel since his L knee replacement.  Still has some swelling of the knee and discomfort. ?He completed physical therapy in late February.  He has f/u with Dr. Erlinda Hong later this month. ? ?Diabetes follow-up:  He reports compliance with metformin once daily, denies side effects. He tries to limit sugar and carbs in his diet. ?His sugars are running 110 or less in the mornings.  Only slightly higher when checked 2 hours postprandial.  No polydipsia, polyuria, hypoglycemia. ?He checks his feet regularly and denies concerns. Some numbness related to his L knee surgery, on the L foot (heel, and had some numbness in the 4th and 5th toes which has resolved). ?Last diabetic eye exam was sometime in late 2022 with Dr. Katy Fitch (records not received). ?   ?Hypertension:  He is compliant with lisinopril and denies side effects. ?Denies dizziness, headaches, chest pain, edema or shortness of breath. ?Had ham and cheese today.  Doesn't normally have a lot of processed meats. He has recently had egg rolls, feta cheese. ?Blood pressures elsewhere are normal.  He recalls getting 114/74 at home today with a wrist monitor.  This monitor hasn't been verified. ? ?BP Readings from Last 3 Encounters:  ?04/30/21 140/80  ?02/14/21 132/80  ?11/27/20 140/86  ? ?Hyperlipidemia follow-up:  Patient is reportedly following a low-fat, low cholesterol diet.  Compliant with medications (atorvastatin 40mg ) and denies medication side effects ? 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 Uses Valtrex prn. ?  ? ?Immunization History  ?Administered Date(s) Administered  ? Influenza,inj,Quad PF,6+ Mos 11/07/2016, 10/24/2017, 11/02/2018, 11/27/2020  ? PFIZER(Purple Top)SARS-COV-2 Vaccination 06/19/2019, 07/17/2019  ? Pneumococcal Conjugate-13 04/27/2018  ? Tdap 10/13/2015  ? Zoster, Live 10/03/2015  ? ?Last colonoscopy: 07/2014 (diverticulosis, internal hemorrhoids) ?Last PSA: ?Lab Results  ?Component Value Date  ? PSA1 0.8 04/26/2021  ? PSA1 0.7 05/05/2020  ? PSA1 0.7 04/29/2019  ? ?Dentist: 2-3 times/year (has dentures, with some intact teeth). ?Ophtho: yearly ?Exercise:  Exercise bike 10 minutes 3-4x/week, does some home exercises from PT (bands).  Can't stand or walk for a long time yet, but walks for 10-15 mins.  Limited by L knee. ? ? ?  Vitamin D 30.4 in 01/2019 ? ? ?PMH, PSH, SH and FH reviewed and updated ? ?Outpatient Encounter Medications as of 04/30/2021  ?Medication Sig Note  ? acetaminophen (TYLENOL) 325 MG tablet Take 1,300 mg by mouth every 6 (six) hours as needed. 04/30/2021: Last dose Saturday  ? atorvastatin (LIPITOR) 40 MG tablet Take 1 tablet (40 mg total) by mouth daily.   ? FIBER PO Take 1 tablet by mouth daily.   ? Glucose Blood  (BLOOD GLUCOSE TEST STRIPS) STRP Test once daily. Pt uses one touch verio flex meter   ? lisinopril (ZESTRIL) 10 MG tablet Take 1 tablet (10 mg total) by mouth daily.   ? metFORMIN (GLUCOPHAGE-XR) 500 MG 24 hr tablet Take 1 tablet (500 mg total) by mouth daily with breakfast.   ? Multiple Vitamin (MULTIVITAMIN WITH MINERALS) TABS tablet Take 1 tablet by mouth daily.   ? OneTouch Delica Lancets [id number] MISC 1 each by Does not apply route daily. One Touch Delica Plus Lancets, patient uses One Touch Verio Flex Meter   ? amoxicillin (AMOXIL) 500 MG capsule Take 4 capsules (2,000 mg total) by mouth 1 hour prior to dental work (Patient not taking: Reported on 02/14/2021) 02/14/2021: Uses prior to dental work  ? methocarbamol (ROBAXIN) 500 MG tablet Take 1 tablet (500 mg total) by mouth 2 (two) times daily as needed. (Patient not taking: Reported on 04/30/2021)   ? valACYclovir (VALTREX) 1000 MG tablet Take 1 tablet (1,000 mg total) by mouth as needed (for outbreaks.). (Patient not taking: Reported on 02/14/2021) 04/30/2021: As needed  ? [DISCONTINUED] rivaroxaban (XARELTO) 20 MG TABS tablet Take 1 tablet (20 mg total) by mouth daily with supper.   ? ?No facility-administered encounter medications on file as of 04/30/2021.  ? ?No Known Allergies ? ?ROS:  Denies anorexia, fever, weight changes, headaches, vision loss, decreased hearing, ear pain, hoarseness, chest pain, palpitations, dizziness, syncope, dyspnea on exertion, cough, swelling, nausea, vomiting, diarrhea, constipation, abdominal pain, melena, indigestion/heartburn, hematuria, incontinence, erectile dysfunction, nocturia, weakened urine stream, dysuria, genital lesions, joint pains, weakness, tremor, suspicious skin lesions, depression, anxiety, abnormal bleeding/bruising, or enlarged lymph nodes ?Numbness persists in L heel since knee surgery. ?L leg/knee pain after walking 15 mins, since knee surgery. ?Hemorrhoids per HPI. ? ? ?PHYSICAL EXAM: ? ?BP 140/80   Pulse 76   Ht  5' 8.75" (1.746 m)   Wt 203 lb (92.1 kg)   BMI 30.20 kg/m?  ?138/80 on repeat ?Wt Readings from Last 3 Encounters:  ?04/30/21 203 lb (92.1 kg)  ?02/14/21 200 lb 3.2 oz (90.8 kg)  ?11/27/20 200 lb (90.7 kg)  ? ?General Appearance:    Alert, cooperative, no distress, appears stated age  ?Head:    Normocephalic, without obvious abnormality, atraumatic  ?Eyes:    PERRL, conjunctiva/corneas clear, EOM's intact  ?Ears:    Normal TM's and external ear canals. Nonocclusive cerumen on the right.  ?Nose:   Not examined, wearing mask  ?Throat:   Not examined, wearing mask  ?Neck:   Supple, no lymphadenopathy;  thyroid:  no enlargement/ tenderness/nodules; no JVD  ?Back:    Spine nontender, no curvature, ROM normal, no CVA tenderness  ?Lungs:     Clear to auscultation bilaterally without wheezes, rales or ronchi; respirations unlabored  ?Chest Wall:    No tenderness or deformity  ? Heart:    Regular rate and rhythm, S1 and S2 normal, no murmur, rub or gallop  ?Breast Exam:    No chest wall tenderness, masses or  gynecomastia  ?Abdomen:     Soft, non-tender, nondistended, normoactive bowel sounds,  ?  no masses, no hepatosplenomegaly  ?Genitalia:    Circumcised, no lesions, no discharge.  Testicles are descended bilaterally, no masses.  No inguinal hernias  ?Rectal:  Normal sphincter tone.  No significantly inflamed hemorrhoids noted.  Prostate is smooth, not enlarged, no nodules. Heme negative stool  ?Extremities:   No clubbing, cyanosis or edema. WHSS L knee, with some tenderness along the L lateral knee.  +mild warmth over L knee joint, no swelling/effusion.  ?Pulses:   2+ and symmetric all extremities  ?Skin:   Skin color, texture (dry on feet), turgor normal, no rashes or lesions. Decreased monofilament sensation at L heel, normal elsewhere.  ?Lymph nodes:   Cervical, supraclavicular, and inguinal nodes normal  ?Neurologic:   Normal strength, sensation and gait; reflexes 2+ and symmetric throughout  ?Psych:             Normal mood, affect, hygiene and grooming.     ? ? ?Lab Results  ?Component Value Date  ? HGBA1C 6.5 (H) 04/26/2021  ? ?  Chemistry   ?   ?Component Value Date/Time  ? NA 138 04/26/2021 0901  ? K 4.6 04/26/2021 090

## 2021-04-30 ENCOUNTER — Encounter: Payer: Self-pay | Admitting: Family Medicine

## 2021-04-30 ENCOUNTER — Other Ambulatory Visit (HOSPITAL_COMMUNITY): Payer: Self-pay

## 2021-04-30 ENCOUNTER — Ambulatory Visit: Payer: 59 | Admitting: Family Medicine

## 2021-04-30 ENCOUNTER — Encounter: Payer: Self-pay | Admitting: *Deleted

## 2021-04-30 ENCOUNTER — Encounter: Payer: 59 | Admitting: Family Medicine

## 2021-04-30 ENCOUNTER — Other Ambulatory Visit: Payer: Self-pay

## 2021-04-30 VITALS — BP 138/80 | HR 76 | Ht 68.75 in | Wt 203.0 lb

## 2021-04-30 DIAGNOSIS — Z86718 Personal history of other venous thrombosis and embolism: Secondary | ICD-10-CM

## 2021-04-30 DIAGNOSIS — M25562 Pain in left knee: Secondary | ICD-10-CM

## 2021-04-30 DIAGNOSIS — I10 Essential (primary) hypertension: Secondary | ICD-10-CM

## 2021-04-30 DIAGNOSIS — Z0000 Encounter for general adult medical examination without abnormal findings: Secondary | ICD-10-CM

## 2021-04-30 DIAGNOSIS — E119 Type 2 diabetes mellitus without complications: Secondary | ICD-10-CM

## 2021-04-30 LAB — POCT URINALYSIS DIP (PROADVANTAGE DEVICE)
Bilirubin, UA: NEGATIVE
Blood, UA: NEGATIVE
Glucose, UA: NEGATIVE mg/dL
Ketones, POC UA: NEGATIVE mg/dL
Leukocytes, UA: NEGATIVE
Nitrite, UA: NEGATIVE
Protein Ur, POC: NEGATIVE mg/dL
Specific Gravity, Urine: 1.02
Urobilinogen, Ur: NEGATIVE
pH, UA: 7 (ref 5.0–8.0)

## 2021-04-30 MED ORDER — LISINOPRIL 10 MG PO TABS
10.0000 mg | ORAL_TABLET | Freq: Every day | ORAL | 3 refills | Status: DC
  Filled 2021-04-30 – 2021-06-04 (×2): qty 90, 90d supply, fill #0
  Filled 2021-09-05: qty 90, 90d supply, fill #1
  Filled 2021-12-06: qty 90, 90d supply, fill #2
  Filled 2022-03-01: qty 90, 90d supply, fill #3

## 2021-04-30 MED ORDER — METFORMIN HCL ER 500 MG PO TB24
500.0000 mg | ORAL_TABLET | Freq: Every day | ORAL | 1 refills | Status: DC
  Filled 2021-04-30: qty 90, 90d supply, fill #0
  Filled 2021-08-28: qty 90, 90d supply, fill #1

## 2021-04-30 MED ORDER — ATORVASTATIN CALCIUM 40 MG PO TABS
40.0000 mg | ORAL_TABLET | Freq: Every day | ORAL | 3 refills | Status: DC
  Filled 2021-04-30 – 2021-06-04 (×2): qty 90, 90d supply, fill #0
  Filled 2021-09-26: qty 90, 90d supply, fill #1
  Filled 2021-12-20: qty 90, 90d supply, fill #2
  Filled 2022-04-16: qty 90, 90d supply, fill #3

## 2021-05-01 NOTE — Progress Notes (Signed)
Please note from Dr. Lynelle Doctor ?

## 2021-05-02 ENCOUNTER — Telehealth: Payer: Self-pay | Admitting: *Deleted

## 2021-05-02 ENCOUNTER — Other Ambulatory Visit: Payer: 59

## 2021-05-02 ENCOUNTER — Other Ambulatory Visit: Payer: Self-pay

## 2021-05-02 NOTE — Telephone Encounter (Signed)
Patient was here for bp check. I got 150/86 his machine read 146/89. He asked me to check again after a few minutes. I got 130/84 and then his machine read 142/87. ?

## 2021-05-03 NOTE — Telephone Encounter (Signed)
Patient advised.

## 2021-05-03 NOTE — Telephone Encounter (Signed)
I think his monitor is okay.  He gets much lower readings at home than what his monitor was reading in the office, so I'm not worried about those pressures (given that his monitor was also getting the higher values in the office). Cont to monitor at home and contact us if they are consistently >135/85. ? ?

## 2021-05-08 ENCOUNTER — Ambulatory Visit (INDEPENDENT_AMBULATORY_CARE_PROVIDER_SITE_OTHER): Payer: No Typology Code available for payment source | Admitting: Orthopaedic Surgery

## 2021-05-08 ENCOUNTER — Ambulatory Visit (INDEPENDENT_AMBULATORY_CARE_PROVIDER_SITE_OTHER): Payer: No Typology Code available for payment source

## 2021-05-08 ENCOUNTER — Other Ambulatory Visit: Payer: Self-pay

## 2021-05-08 ENCOUNTER — Other Ambulatory Visit (HOSPITAL_COMMUNITY): Payer: Self-pay

## 2021-05-08 DIAGNOSIS — Z96652 Presence of left artificial knee joint: Secondary | ICD-10-CM | POA: Diagnosis not present

## 2021-05-08 NOTE — Progress Notes (Signed)
? ?Office Visit Note ?  ?Patient: Randall Calderon           ?Date of Birth: July 16, 1965           ?MRN: [id number] ?Visit Date: 05/08/2021 ?             ?Requested by: Avanell Shackleton, NP-C ?[address] ?[place],  [place] [address] ?PCP: Joselyn Arrow, MD ? ? ?Assessment & Plan: ?Visit Diagnoses:  ?1. History of total knee replacement, left   ? ? ?Plan: [name] is now 6 months status post left total knee replacement.  At this point we will send an order for work conditioning and FCE to follow.  He will follow up with [place] once he has completed these so that we can determine his rating.  Case manager present today and help facilitate with the encounter. ? ?Total face to face encounter time was greater than 25 minutes and over half of this time was spent in counseling and/or coordination of care. ? ?Follow-Up Instructions: No follow-ups on file.  ? ?Orders:  ?Orders Placed This Encounter  ?Procedures  ? XR Knee 1-2 Views Left  ? ?No orders of the defined types were placed in this encounter. ? ? ? ? Procedures: ?No procedures performed ? ? ?Clinical Data: ?No additional findings. ? ? ?Subjective: ?Chief Complaint  ?Patient presents with  ? Left Knee - Follow-up  ? ? ?HPI ? ?[name] is status post left total knee replacement on 11/13/2020.  Case manager Shanda Bumps is present today.  Wife was present by FaceTime.  He is overall doing okay with some pain mainly with exercise.  He has been released from physical therapy per notes from the PT office.  Currently out of work.  He does not feel like he is ready to return back to his previous job given the physical demands. ? ?Review of Systems  ?Constitutional: Negative.   ?All other systems reviewed and are negative. ? ? ?Objective: ?Vital Signs: There were no vitals taken for this visit. ? ?Physical Exam ?Vitals and nursing note reviewed.  ?Constitutional:   ?   Appearance: He is well-developed.  ?Pulmonary:  ?   Effort: Pulmonary effort is normal.  ?Abdominal:  ?   Palpations:  Abdomen is soft.  ?Skin: ?   General: Skin is warm.  ?Neurological:  ?   Mental Status: He is alert and oriented to person, place, and time.  ?Psychiatric:     ?   Behavior: Behavior normal.     ?   Thought Content: Thought content normal.     ?   Judgment: Judgment normal.  ? ? ?Ortho Exam ? ?Examination of the left knee shows a fully healed surgical scar.  Range of motion is functional.  Stable to varus valgus.  No significant swelling to the knee.  No evidence of infection.  He is ambulating without any devices. ? ?Specialty Comments:  ?No specialty comments available. ? ?Imaging: ?XR Knee 1-2 Views Left ? ?Result Date: 05/08/2021 ?Stable total knee replacement in good alignment.   ? ? ?PMFS History: ?Patient Active Problem List  ? Diagnosis Date Noted  ? Acute deep vein thrombosis (DVT) of left peroneal vein (HCC) 11/26/2020  ? Status post total left knee replacement 11/13/2020  ? Primary osteoarthritis of left knee 06/23/2020  ? Obesity (BMI 30-39.9) 05/05/2020  ? S/P left knee arthroscopy 04/04/2020  ? Acute medial meniscal tear, left, initial encounter 11/02/2019  ? Irritant contact dermatitis due to drug  in contact with skin 04/15/2018  ? HSV infection 11/11/2017  ? Anemia, unspecified 10/29/2017  ? Elevated alkaline phosphatase measurement 10/29/2017  ? Synovitis of knee 01/18/2017  ? Unilateral primary osteoarthritis, right knee 12/23/2016  ? Controlled type 2 diabetes mellitus without complication, without long-term current use of insulin (HCC) 04/03/2016  ? BPPV (benign paroxysmal positional vertigo) 04/03/2016  ? Chronic right shoulder pain 04/03/2016  ? Essential hypertension 04/03/2016  ? ?Past Medical History:  ?Diagnosis Date  ? Anemia, unspecified 10/29/2017  ? DM type 2 (diabetes mellitus, type 2) (HCC)   ? Elevated alkaline phosphatase measurement 10/29/2017  ? History of hemorrhoids   ? HSV infection 11/11/2017  ? HTN (hypertension)   ? Obesity (BMI 30.0-34.9)   ?  ?Family History  ?Problem Relation  Age of Onset  ? Stroke Mother   ?     trauma (beaten with baseball bat) 70's  ? Cancer Mother   ? Throat cancer Father   ? Diabetes Sister   ? Hypertension Brother   ?  ?Past Surgical History:  ?Procedure Laterality Date  ? KNEE ARTHROSCOPY WITH MENISCAL REPAIR Left 11/26/2019  ? Procedure: LEFT KNEE ARTHROSCOPY WITH MENISCAL ROOT REPAIR VS. DEBRIDEMENT;  Surgeon: Tarry Kos, MD;  Location: [facility];  Service: Orthopedics;  Laterality: Left;  ? KNEE SURGERY    ? 18 years ago  ? TONSILLECTOMY    ? TOTAL KNEE ARTHROPLASTY Left 11/13/2020  ? Procedure: LEFT TOTAL KNEE ARTHROPLASTY;  Surgeon: Tarry Kos, MD;  Location: [facility];  Service: Orthopedics;  Laterality: Left;  ? ?Social History  ? ?Occupational History  ? Not on file  ?Tobacco Use  ? Smoking status: Former  ?  Years: 5.00  ?  Types: Cigarettes  ? Smokeless tobacco: Never  ?Vaping Use  ? Vaping Use: Never used  ?Substance and Sexual Activity  ? Alcohol use: No  ? Drug use: No  ?  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   ? Sexual activity: Not on file  ? ? ? ? ? ? ?

## 2021-05-10 ENCOUNTER — Other Ambulatory Visit (HOSPITAL_COMMUNITY): Payer: Self-pay

## 2021-05-10 ENCOUNTER — Other Ambulatory Visit: Payer: Self-pay | Admitting: Physician Assistant

## 2021-05-11 ENCOUNTER — Other Ambulatory Visit (HOSPITAL_COMMUNITY): Payer: Self-pay

## 2021-05-11 ENCOUNTER — Other Ambulatory Visit: Payer: Self-pay | Admitting: Physician Assistant

## 2021-05-11 ENCOUNTER — Telehealth: Payer: Self-pay

## 2021-05-11 MED ORDER — METHOCARBAMOL 500 MG PO TABS
500.0000 mg | ORAL_TABLET | Freq: Two times a day (BID) | ORAL | 0 refills | Status: DC | PRN
  Filled 2021-05-11: qty 20, 10d supply, fill #0

## 2021-05-11 NOTE — Telephone Encounter (Signed)
Patient called asking if he can have a refill on his methocarbamol .  ? ?Please advise  ?

## 2021-05-11 NOTE — Telephone Encounter (Signed)
Sent in

## 2021-06-04 ENCOUNTER — Other Ambulatory Visit (HOSPITAL_COMMUNITY): Payer: Self-pay

## 2021-06-05 ENCOUNTER — Encounter: Payer: Self-pay | Admitting: *Deleted

## 2021-06-06 ENCOUNTER — Encounter: Payer: Self-pay | Admitting: Family Medicine

## 2021-06-20 ENCOUNTER — Other Ambulatory Visit: Payer: Self-pay | Admitting: Physician Assistant

## 2021-06-22 ENCOUNTER — Other Ambulatory Visit (HOSPITAL_COMMUNITY): Payer: Self-pay

## 2021-06-25 ENCOUNTER — Telehealth: Payer: Self-pay | Admitting: Orthopaedic Surgery

## 2021-06-25 NOTE — Telephone Encounter (Signed)
Randall Calderon states that the medication he was prescribed is still waiting on physician authorization at the pharmacy. Please advise ?

## 2021-06-26 ENCOUNTER — Other Ambulatory Visit (HOSPITAL_COMMUNITY): Payer: Self-pay

## 2021-06-26 MED ORDER — METHOCARBAMOL 500 MG PO TABS
500.0000 mg | ORAL_TABLET | Freq: Two times a day (BID) | ORAL | 0 refills | Status: DC | PRN
  Filled 2021-06-26: qty 20, 10d supply, fill #0

## 2021-06-26 NOTE — Telephone Encounter (Signed)
Sent to pharmacy. Pt was called and informed ?

## 2021-06-26 NOTE — Telephone Encounter (Signed)
Called pharmacy. Pt is wanting a refill on muscle relaxer. Is this ok? ?

## 2021-06-26 NOTE — Telephone Encounter (Signed)
Yes, can you send in?

## 2021-07-20 ENCOUNTER — Other Ambulatory Visit: Payer: Self-pay | Admitting: Physician Assistant

## 2021-07-20 MED ORDER — METHOCARBAMOL 500 MG PO TABS
500.0000 mg | ORAL_TABLET | Freq: Two times a day (BID) | ORAL | 0 refills | Status: DC | PRN
Start: 2021-07-20 — End: 2021-08-30
  Filled 2021-07-20: qty 20, 10d supply, fill #0

## 2021-07-20 NOTE — Telephone Encounter (Signed)
Patient called in needing refill of Methocarbamol Rx

## 2021-07-23 ENCOUNTER — Other Ambulatory Visit (HOSPITAL_COMMUNITY): Payer: Self-pay

## 2021-08-09 ENCOUNTER — Encounter: Payer: 59 | Admitting: Family Medicine

## 2021-08-10 ENCOUNTER — Ambulatory Visit: Payer: 59 | Admitting: Orthopaedic Surgery

## 2021-08-28 ENCOUNTER — Other Ambulatory Visit (HOSPITAL_COMMUNITY): Payer: Self-pay

## 2021-08-29 ENCOUNTER — Encounter (HOSPITAL_COMMUNITY): Payer: Self-pay

## 2021-08-29 ENCOUNTER — Ambulatory Visit (HOSPITAL_COMMUNITY)
Admission: EM | Admit: 2021-08-29 | Discharge: 2021-08-29 | Disposition: A | Discharge status: Home / Self Care | Payer: 59 | Attending: Physician Assistant | Admitting: Physician Assistant

## 2021-08-29 DIAGNOSIS — Z23 Encounter for immunization: Secondary | ICD-10-CM | POA: Diagnosis not present

## 2021-08-29 DIAGNOSIS — S61310A Laceration without foreign body of right index finger with damage to nail, initial encounter: Secondary | ICD-10-CM | POA: Diagnosis not present

## 2021-08-29 DIAGNOSIS — R030 Elevated blood-pressure reading, without diagnosis of hypertension: Secondary | ICD-10-CM | POA: Diagnosis not present

## 2021-08-29 MED ORDER — HYDROCODONE-ACETAMINOPHEN 5-325 MG PO TABS: 1.0000 | ORAL_TABLET | Freq: Two times a day (BID) | ORAL | 0 refills | Status: AC | PRN

## 2021-08-29 MED ORDER — TETANUS-DIPHTH-ACELL PERTUSSIS 5-2.5-18.5 LF-MCG/0.5 IM SUSY
PREFILLED_SYRINGE | INTRAMUSCULAR | Status: AC
  Filled 2021-08-29: qty 0.5

## 2021-08-29 MED ORDER — LIDOCAINE HCL (PF) 1 % IJ SOLN
INTRAMUSCULAR | Status: AC
  Filled 2021-08-29: qty 2

## 2021-08-29 MED ORDER — TETANUS-DIPHTH-ACELL PERTUSSIS 5-2.5-18.5 LF-MCG/0.5 IM SUSY
0.5000 mL | PREFILLED_SYRINGE | Freq: Once | INTRAMUSCULAR | Status: AC
  Administered 2021-08-29: 0.5 mL via INTRAMUSCULAR

## 2021-08-29 NOTE — ED Triage Notes (Signed)
Pt was cutting up watermelon today and cut the tip of his index finger on his right hand. Last TDAP-unknown

## 2021-08-29 NOTE — Discharge Instructions (Addendum)
We placed a total of 6 sutures.  Please follow-up with hand surgeon tomorrow.  Please call to schedule an appointment as soon as possible and hopefully they will be able to see you tomorrow.  I sent in a few doses of hydrocodone.  Do not drive or drink alcohol with taking this medication.  If you have any worsening symptoms including increased pain, numbness, tingling, decreased range of motion, discoloration need to go to the emergency room immediately.  Your blood pressure was elevated.  If developing chest pain, shortness of breath, headache, vision change in setting of high blood pressure you need to go to the emergency room.  Follow-up with your primary care provider within the next week for recheck.

## 2021-08-29 NOTE — ED Provider Notes (Signed)
[facility]    CSN: [id number] Arrival date & time: 08/29/21  1716      History   Chief Complaint Chief Complaint  Patient presents with   Laceration    HPI Randall Calderon is a 56 y.o. male.   Patient presents today with a several hour history of laceration to his right index finger.  Reports that he was try to cut a watermelon when he lost control of the knife cutting his left distal right distal index finger.  He is left-handed.  He denies any numbness or paresthesias.  He is unsure when his last tetanus was but according to EMR was in 2017.  He did clean area with soap and water prior to presenting to clinic.  He does report ongoing bleeding.  Does not take any blood thinning medications.    Past Medical History:  Diagnosis Date   Anemia, unspecified 10/29/2017   DM type 2 (diabetes mellitus, type 2) (HCC)    Elevated alkaline phosphatase measurement 10/29/2017   History of hemorrhoids    HSV infection 11/11/2017   HTN (hypertension)    Obesity (BMI 30.0-34.9)     Patient Active Problem List   Diagnosis Date Noted   Acute deep vein thrombosis (DVT) of left peroneal vein (HCC) 11/26/2020   Status post total left knee replacement 11/13/2020   Primary osteoarthritis of left knee 06/23/2020   Obesity (BMI 30-39.9) 05/05/2020   S/P left knee arthroscopy 04/04/2020   Acute medial meniscal tear, left, initial encounter 11/02/2019   Irritant contact dermatitis due to drug in contact with skin 04/15/2018   HSV infection 11/11/2017   Anemia, unspecified 10/29/2017   Elevated alkaline phosphatase measurement 10/29/2017   Synovitis of knee 01/18/2017   Unilateral primary osteoarthritis, right knee 12/23/2016   Controlled type 2 diabetes mellitus without complication, without long-term current use of insulin (HCC) 04/03/2016   BPPV (benign paroxysmal positional vertigo) 04/03/2016   Chronic right shoulder pain 04/03/2016   Essential hypertension 04/03/2016    Past  Surgical History:  Procedure Laterality Date   KNEE ARTHROSCOPY WITH MENISCAL REPAIR Left 11/26/2019   Procedure: LEFT KNEE ARTHROSCOPY WITH MENISCAL ROOT REPAIR VS. DEBRIDEMENT;  Surgeon: Tarry Kos, MD;  Location: [facility];  Service: Orthopedics;  Laterality: Left;   KNEE SURGERY     18 years ago   TONSILLECTOMY     TOTAL KNEE ARTHROPLASTY Left 11/13/2020   Procedure: LEFT TOTAL KNEE ARTHROPLASTY;  Surgeon: Tarry Kos, MD;  Location: [facility];  Service: Orthopedics;  Laterality: Left;       Home Medications    Prior to Admission medications   Medication Sig Start Date End Date Taking? Authorizing Provider  HYDROcodone-acetaminophen (NORCO/VICODIN) 5-325 MG tablet Take 1 tablet by mouth 2 (two) times daily as needed for up to 2 days. 08/29/21 08/31/21 Yes Andrian Sabala, Noberto Retort, PA-C  acetaminophen (TYLENOL) 325 MG tablet Take 1,300 mg by mouth every 6 (six) hours as needed.    [provider]  atorvastatin (LIPITOR) 40 MG tablet Take 1 tablet (40 mg total) by mouth daily. 04/30/21   Joselyn Arrow, MD  FIBER PO Take 1 tablet by mouth daily.    [provider]  Glucose Blood (BLOOD GLUCOSE TEST STRIPS) STRP Test once daily. Pt uses one touch verio flex meter 10/24/17   Henson, Vickie L, NP-C  lisinopril (ZESTRIL) 10 MG tablet Take 1 tablet (10 mg total) by mouth daily. 04/30/21   Joselyn Arrow, MD  metFORMIN (  GLUCOPHAGE-XR) 500 MG 24 hr tablet Take 1 tablet (500 mg total) by mouth daily with breakfast. 04/30/21   Joselyn Arrow, MD  methocarbamol (ROBAXIN) 500 MG tablet Take 1 tablet (500 mg total) by mouth 2 (two) times daily as needed. 07/20/21   Tarry Kos, MD  Multiple Vitamin (MULTIVITAMIN WITH MINERALS) TABS tablet Take 1 tablet by mouth daily.    [provider]  OneTouch Delica Lancets 33G MISC 1 each by Does not apply route daily. One Touch Delica Plus Lancets, patient uses One Touch Verio Flex Meter 11/27/20   Joselyn Arrow, MD  valACYclovir (VALTREX) 1000 MG  tablet Take 1 tablet (1,000 mg total) by mouth as needed (for outbreaks.). Patient not taking: Reported on 02/14/2021 05/05/20   Avanell Shackleton, NP-C    Family History Family History  Problem Relation Age of Onset   Stroke Mother        trauma (beaten with baseball bat) 16's   Cancer Mother    Throat cancer Father    Diabetes Sister    Hypertension Brother     Social History Social History   Tobacco Use   Smoking status: Former    Years: 5.00    Types: Cigarettes   Smokeless tobacco: Never  Vaping Use   Vaping Use: Never used  Substance Use Topics   Alcohol use: No   Drug use: No    Comment: history of heroin use and stopped 1998.      Allergies   Patient has no known allergies.   Review of Systems Review of Systems  Constitutional:  Positive for activity change. Negative for appetite change, fatigue and fever.  Musculoskeletal:  Negative for arthralgias and myalgias.  Skin:  Positive for wound.  Neurological:  Negative for dizziness, weakness, light-headedness, numbness and headaches.     Physical Exam Triage Vital Signs ED Triage Vitals  Enc Vitals Group     BP 08/29/21 1725 (!) 171/92     Pulse Rate 08/29/21 1725 64     Resp 08/29/21 1725 16     Temp 08/29/21 1725 98.6 F (37 C)     Temp Source 08/29/21 1725 Oral     SpO2 08/29/21 1725 98 %     Weight --      Height --      Head Circumference --      Peak Flow --      Pain Score 08/29/21 1726 7     Pain Loc --      Pain Edu? --      Excl. in GC? --    No data found.  Updated Vital Signs BP (!) 149/95 (BP Location: Left Arm)   Pulse 72   Temp 98.4 F (36.9 C) (Oral)   Resp 16   SpO2 98%   Visual Acuity Right Eye Distance:   Left Eye Distance:   Bilateral Distance:    Right Eye Near:   Left Eye Near:    Bilateral Near:     Physical Exam Vitals reviewed.  Constitutional:      General: He is awake.     Appearance: Normal appearance. He is well-developed. He is not ill-appearing.      Comments: Very pleasant male appears stated age in no acute distress sitting comfortably in exam room  HENT:     Head: Normocephalic and atraumatic.     Mouth/Throat:     Pharynx: No oropharyngeal exudate, posterior oropharyngeal erythema or uvula swelling.  Cardiovascular:  Rate and Rhythm: Normal rate and regular rhythm.     Heart sounds: Normal heart sounds, S1 normal and S2 normal. No murmur heard.    Comments: Capillary fill within 2 seconds right index finger Pulmonary:     Effort: Pulmonary effort is normal. No accessory muscle usage or respiratory distress.  Skin:    Findings: Laceration present.     Comments: 5 cm laceration noted lateral nail involving distal nailbed.  Neurological:     Mental Status: He is alert.  Psychiatric:        Behavior: Behavior is cooperative.      UC Treatments / Results  Labs (all labs ordered are listed, but only abnormal results are displayed) Labs Reviewed - No data to display  EKG   Radiology No results found.  Procedures Laceration Repair  Date/Time: 08/29/2021 6:44 PM  Performed by: Jeani Hawking, PA-C Authorized by: Jeani Hawking, PA-C   Consent:    Consent obtained:  Verbal   Consent given by:  Patient   Risks discussed:  Poor cosmetic result, poor wound healing and need for additional repair   Alternatives discussed:  Observation and referral Universal protocol:    Procedure explained and questions answered to patient or proxy's satisfaction: yes     Patient identity confirmed:  Verbally with patient Anesthesia:    Anesthesia method:  Local infiltration   Local anesthetic:  Lidocaine 1% w/o epi Laceration details:    Location:  Finger   Finger location:  R index finger   Length (cm):  5   Depth (mm):  4 Pre-procedure details:    Preparation:  Patient was prepped and draped in usual sterile fashion Exploration:    Hemostasis achieved with:  Direct pressure and tourniquet   Imaging outcome: foreign body not  noted     Wound exploration: entire depth of wound visualized     Contaminated: no   Treatment:    Area cleansed with:  Chlorhexidine   Amount of cleaning:  Standard   Irrigation solution:  Tap water   Irrigation volume:  60 mL   Irrigation method:  Syringe   Visualized foreign bodies/material removed: no     Debridement:  None   Undermining:  None Skin repair:    Repair method:  Sutures   Suture size:  5-0   Suture material:  Prolene   Suture technique:  Simple interrupted   Number of sutures:  6 Approximation:    Approximation:  Close Post-procedure details:    Dressing:  Splint for protection   Procedure completion:  Tolerated  (including critical care time)  Medications Ordered in UC Medications  Tdap (BOOSTRIX) injection 0.5 mL (0.5 mLs Intramuscular Given 08/29/21 1826)    Initial Impression / Assessment and Plan / UC Course  I have reviewed the triage vital signs and the nursing notes.  Pertinent labs & imaging results that were available during my care of the patient were reviewed by me and considered in my medical decision making (see chart for details).     Wound was cleaned and irrigated with tap water in clinic.  Discussed case with Dr. Tracie Harrier who evaluated patient and recommended closing around nailbed and having close follow-up with hand.  6 sutures placed (see procedure note above).  Tetanus was updated today.  Patient was placed in splint for comfort.  He was given several doses of hydrocodone for pain relief with instruction not to drive or drink alcohol while taking this medication.  Review of  [place] controlled substance database shows no inappropriate refill.  Recommended that he follow-up with hand specialist first thing tomorrow.  There was ongoing blanching of skin flap and discussed that if this becomes discolored or changes he needs to go to the emergency room.  Recommended close follow-up with hand specialist.  He was given contact information for  provider on-call with instruction to call first thing in the morning to schedule an appointment.  If he has any worsening symptoms including decreased range of motion, numbness, discoloration, increased pain he is to go to the emergency room immediately.  Blood pressure was elevated but improved following wound closure.  Suspect this is related to stress of injury.  Denies any signs/symptoms of endorgan damage.  Recommended follow-up with his primary care.  If he develops any chest pain, shortness of breath, headache, vision change he is to go to the ER.  Final Clinical Impressions(s) / UC Diagnoses   Final diagnoses:  Laceration of right index finger without foreign body with damage to nail, initial encounter  Elevated blood pressure reading     Discharge Instructions      We placed a total of 6 sutures.  Please follow-up with hand surgeon tomorrow.  Please call to schedule an appointment as soon as possible and hopefully they will be able to see you tomorrow.  I sent in a few doses of hydrocodone.  Do not drive or drink alcohol with taking this medication.  If you have any worsening symptoms including increased pain, numbness, tingling, decreased range of motion, discoloration need to go to the emergency room immediately.  Your blood pressure was elevated.  If developing chest pain, shortness of breath, headache, vision change in setting of high blood pressure you need to go to the emergency room.  Follow-up with your primary care provider within the next week for recheck.     ED Prescriptions     Medication Sig Dispense Auth. Provider   HYDROcodone-acetaminophen (NORCO/VICODIN) 5-325 MG tablet Take 1 tablet by mouth 2 (two) times daily as needed for up to 2 days. 4 tablet Mica Ramdass K, PA-C      I have reviewed the PDMP during this encounter.   Jeani Hawking, PA-C 08/29/21 1846

## 2021-08-30 ENCOUNTER — Encounter: Payer: Self-pay | Admitting: Orthopaedic Surgery

## 2021-08-30 ENCOUNTER — Other Ambulatory Visit (HOSPITAL_COMMUNITY): Payer: Self-pay

## 2021-08-30 ENCOUNTER — Ambulatory Visit (INDEPENDENT_AMBULATORY_CARE_PROVIDER_SITE_OTHER): Payer: No Typology Code available for payment source | Admitting: Orthopaedic Surgery

## 2021-08-30 DIAGNOSIS — Z96652 Presence of left artificial knee joint: Secondary | ICD-10-CM | POA: Diagnosis not present

## 2021-08-30 MED ORDER — METHOCARBAMOL 500 MG PO TABS
500.0000 mg | ORAL_TABLET | Freq: Two times a day (BID) | ORAL | 6 refills | Status: DC | PRN
  Filled 2021-08-30: qty 30, 15d supply, fill #0
  Filled 2021-12-20: qty 30, 15d supply, fill #1
  Filled 2022-01-18: qty 30, 15d supply, fill #2
  Filled 2022-02-17: qty 30, 15d supply, fill #3
  Filled 2022-05-20: qty 30, 15d supply, fill #4
  Filled 2022-07-11: qty 30, 15d supply, fill #5
  Filled 2022-08-28: qty 30, 15d supply, fill #6

## 2021-08-30 NOTE — Progress Notes (Signed)
Office Visit Note   Patient: Randall Calderon           Date of Birth: [birth year]           MRN: [id number] Visit Date: 08/30/2021              Requested by: Joselyn Arrow, MD [id number] [place],  [place] [address] PCP: Joselyn Arrow, MD   Assessment & Plan: Visit Diagnoses:  1. Status post total left knee replacement     Plan: FCE results are valid and the restrictions were reviewed with the patient and case manager.  He is released to those restrictions detailed within the FCE.  Based on my review of the [employer] guide I give him a rating of 25% to the left knee which is broken down into 15% for the restricted range of motion and 10% for the surgery.  Questions encouraged and answered.  Robaxin refilled.  Follow-up as needed.  Total face to face encounter time was greater than 25 minutes and over half of this time was spent in counseling and/or coordination of care.  Follow-Up Instructions: No follow-ups on file.   Orders:  No orders of the defined types were placed in this encounter.  Meds ordered this encounter  Medications   methocarbamol (ROBAXIN) 500 MG tablet    Sig: Take 1 tablet (500 mg total) by mouth 2 (two) times daily as needed.    Dispense:  30 tablet    Refill:  6      Procedures: No procedures performed   Clinical Data: No additional findings.   Subjective: Chief Complaint  Patient presents with   Left Knee - Pain    Left TKA 11/13/2020    HPI Mr. Bleecker returns today to review recent FCE and get a rating.  Case manager Shanda Bumps present today.  Wife is also present.  Review of Systems   Objective: Vital Signs: There were no vitals taken for this visit.  Physical Exam  Ortho Exam Examination of left knee shows a fully healed surgical scar.  Guarding to passive range of motion.  Range of motion is 10 to 110 degrees.  Collaterals feel stable.  No signs of infection. Specialty Comments:  No specialty comments  available.  Imaging: No results found.   PMFS History: Patient Active Problem List   Diagnosis Date Noted   Acute deep vein thrombosis (DVT) of left peroneal vein (HCC) 11/26/2020   Status post total left knee replacement 11/13/2020   Primary osteoarthritis of left knee 06/23/2020   Obesity (BMI 30-39.9) 05/05/2020   S/P left knee arthroscopy 04/04/2020   Acute medial meniscal tear, left, initial encounter 11/02/2019   Irritant contact dermatitis due to drug in contact with skin 04/15/2018   HSV infection 11/11/2017   Anemia, unspecified 10/29/2017   Elevated alkaline phosphatase measurement 10/29/2017   Synovitis of knee 01/18/2017   Unilateral primary osteoarthritis, right knee 12/23/2016   Controlled type 2 diabetes mellitus without complication, without long-term current use of insulin (HCC) 04/03/2016   BPPV (benign paroxysmal positional vertigo) 04/03/2016   Chronic right shoulder pain 04/03/2016   Essential hypertension 04/03/2016   Past Medical History:  Diagnosis Date   Anemia, unspecified 10/29/2017   DM type 2 (diabetes mellitus, type 2) (HCC)    Elevated alkaline phosphatase measurement 10/29/2017   History of hemorrhoids    HSV infection 11/11/2017   HTN (hypertension)    Obesity (BMI 30.0-34.9)     Family History  Problem  Relation Age of Onset   Stroke Mother        trauma (beaten with baseball bat) 16's   Cancer Mother    Throat cancer Father    Diabetes Sister    Hypertension Brother     Past Surgical History:  Procedure Laterality Date   KNEE ARTHROSCOPY WITH MENISCAL REPAIR Left 11/26/2019   Procedure: LEFT KNEE ARTHROSCOPY WITH MENISCAL ROOT REPAIR VS. DEBRIDEMENT;  Surgeon: Tarry Kos, MD;  Location: [facility];  Service: Orthopedics;  Laterality: Left;   KNEE SURGERY     18 years ago   TONSILLECTOMY     TOTAL KNEE ARTHROPLASTY Left 11/13/2020   Procedure: LEFT TOTAL KNEE ARTHROPLASTY;  Surgeon: Tarry Kos, MD;  Location: [facility];  Service: Orthopedics;  Laterality: Left;   Social History   Occupational History   Not on file  Tobacco Use   Smoking status: Former    Years: 5.00    Types: Cigarettes   Smokeless tobacco: Never  Vaping Use   Vaping Use: Never used  Substance and Sexual Activity   Alcohol use: No   Drug use: No    Comment: history of heroin use and stopped 1998.    Sexual activity: Not on file

## 2021-09-05 ENCOUNTER — Telehealth: Payer: Self-pay | Admitting: Family Medicine

## 2021-09-05 ENCOUNTER — Other Ambulatory Visit (HOSPITAL_COMMUNITY): Payer: Self-pay

## 2021-09-05 NOTE — Progress Notes (Unsigned)
  Patient presents for urgent care follow-up and suture removal.  He was seen in the [facility] 7/19 with laceration to his right index finger.  He cut his finger while trying to cut a watermelon. 5 cm wound was sutured closed with 6 interrupted sutures, and he was given a TdaP booster.   PMH, PSH, SH reviewed    ROS: no fever, chills, URI symptoms, numbness, tingling, bleeding, rash   PHYSICAL EXAM:  There were no vitals taken for this visit.  Wt Readings from Last 3 Encounters:  04/30/21 203 lb (92.1 kg)  02/14/21 200 lb 3.2 oz (90.8 kg)  11/27/20 200 lb (90.7 kg)       ASSESSMENT/PLAN:  Sutures removed

## 2021-09-05 NOTE — Telephone Encounter (Signed)
Requesting refill on his lisinopril.

## 2021-09-05 NOTE — Telephone Encounter (Signed)
Called in to pharmacy 05/03/21 #90 with 3 refills. No need to send new rx. I called pharmacy and had them refill for patient.

## 2021-09-06 ENCOUNTER — Ambulatory Visit: Payer: 59 | Admitting: Family Medicine

## 2021-09-06 ENCOUNTER — Encounter: Payer: Self-pay | Admitting: Family Medicine

## 2021-09-06 VITALS — BP 130/84 | HR 72 | Ht 68.75 in | Wt 207.6 lb

## 2021-09-06 DIAGNOSIS — R0902 Hypoxemia: Secondary | ICD-10-CM

## 2021-09-06 DIAGNOSIS — Z5189 Encounter for other specified aftercare: Secondary | ICD-10-CM | POA: Diagnosis not present

## 2021-09-06 DIAGNOSIS — Z4802 Encounter for removal of sutures: Secondary | ICD-10-CM

## 2021-09-10 ENCOUNTER — Ambulatory Visit: Payer: 59 | Admitting: Family Medicine

## 2021-09-10 ENCOUNTER — Encounter: Payer: Self-pay | Admitting: Family Medicine

## 2021-09-10 VITALS — BP 138/80 | HR 72 | Ht 68.75 in | Wt 208.0 lb

## 2021-09-10 DIAGNOSIS — Z4802 Encounter for removal of sutures: Secondary | ICD-10-CM | POA: Diagnosis not present

## 2021-09-10 NOTE — Progress Notes (Signed)
Chief Complaint  Patient presents with   Suture / Staple Removal    Suture removal.   Patient presents for suture removal. He denies pain, crusting/drainage or concerns. He has been cleaning with peroxide, and doing some soaks.  PMH, PSH, SH reviewed Outpatient Encounter Medications as of 09/10/2021  Medication Sig Note   atorvastatin (LIPITOR) 40 MG tablet Take 1 tablet (40 mg total) by mouth daily.    FIBER PO Take 1 tablet by mouth daily.    Glucose Blood (BLOOD GLUCOSE TEST STRIPS) STRP Test once daily. Pt uses one touch verio flex meter    lisinopril (ZESTRIL) 10 MG tablet Take 1 tablet (10 mg total) by mouth daily.    metFORMIN (GLUCOPHAGE-XR) 500 MG 24 hr tablet Take 1 tablet (500 mg total) by mouth daily with breakfast.    Multiple Vitamin (MULTIVITAMIN WITH MINERALS) TABS tablet Take 1 tablet by mouth daily.    OneTouch Delica Lancets 33G MISC 1 each by Does not apply route daily. One Touch Delica Plus Lancets, patient uses One Touch Verio Flex Meter    acetaminophen (TYLENOL) 325 MG tablet Take 1,300 mg by mouth every 6 (six) hours as needed. (Patient not taking: Reported on 09/06/2021) 09/06/2021: prn   methocarbamol (ROBAXIN) 500 MG tablet Take 1 tablet (500 mg total) by mouth 2 (two) times daily as needed. (Patient not taking: Reported on 09/06/2021) 09/06/2021: Most days   valACYclovir (VALTREX) 1000 MG tablet Take 1 tablet (1,000 mg total) by mouth as needed (for outbreaks.). (Patient not taking: Reported on 09/06/2021) 04/30/2021: As needed   No facility-administered encounter medications on file as of 09/10/2021.   No Known Allergies  ROS: no fever, chills, pain, SOB or other concerns.   PHYSICAL EXAM:  BP 138/80   Pulse 72   Ht 5' 8.75" (1.746 m)   Wt 208 lb (94.3 kg)   BMI 30.94 kg/m   Well-appearing male, in good spirits. Right index finger-- 1 suture distal to the nail, the other 5 were on the radial portion at mid-nail. No sutures within the open portion  between the nail. There appears to be a free-standing piece of nail that is still attached. No soft tissue swelling, erythema, warmth, crusting. 6 interrupted sutures were easily removed, without complication.   ASSESSMENT/PLAN:  Visit for suture removal  Wound is well healed.  Expect the small piece of nail to fall off. Proper wound care reviewed (stop H2O2)  F/u for CPE as scheduled.

## 2021-09-26 ENCOUNTER — Other Ambulatory Visit (HOSPITAL_COMMUNITY): Payer: Self-pay

## 2021-10-02 ENCOUNTER — Other Ambulatory Visit (HOSPITAL_COMMUNITY): Payer: Self-pay

## 2021-10-02 MED ORDER — AMOXICILLIN 500 MG PO CAPS
2000.0000 mg | ORAL_CAPSULE | ORAL | 1 refills | Status: DC
Start: 2021-10-02 — End: 2022-03-26
  Filled 2021-10-02: qty 12, 3d supply, fill #0
  Filled 2021-12-06: qty 12, 3d supply, fill #1

## 2021-10-17 ENCOUNTER — Encounter: Payer: Self-pay | Admitting: Internal Medicine

## 2021-10-23 NOTE — Progress Notes (Unsigned)
  No chief complaint on file.  Patient presents for 6 month follow-up on chronic problems.  Diabetes follow-up:  He reports compliance with metformin once daily, denies side effects. He tries to limit sugar and carbs in his diet. Last A1c was 6.5% in 04/2021. His sugars are running   No polydipsia, polyuria, hypoglycemia. He checks his feet regularly and denies concerns. Some numbness related to his L knee surgery, on the L foot (heel). Last diabetic eye exam was 12/2020, no retinopathy.   Hypertension:  He is compliant with lisinopril and denies side effects. Denies dizziness, headaches, chest pain, edema or shortness of breath. Doesn't normally have a lot of processed meats. Blood pressures elsewhere are normal.    BP Readings from Last 3 Encounters:  09/10/21 138/80  09/06/21 130/84  08/29/21 (!) 149/95     Hyperlipidemia follow-up:  Patient is reportedly following a low-fat, low cholesterol diet.  Compliant with medications (atorvastatin 40mg ) and denies medication side effects. Lipids were at goal on last check. Lab Results  Component Value Date   CHOL 120 04/26/2021   HDL 46 04/26/2021   LDLCALC 61 04/26/2021   TRIG 62 04/26/2021   CHOLHDL 2.6 04/26/2021    Genital HSV breakouts- no recent outbreaks, cannot recall the last one (long time ago).. Uses Valtrex prn.   PMH, PSH, SH reviewed   ROS:  no fever, chills, URI symptoms, headaches, dizziness, chest pain, palpitations, shortness of breath, edema. Moods are good. No bleeding, bruising, rash, lesions. Hemorrhoids?   PHYSICAL EXAM:  There were no vitals taken for this visit.  Wt Readings from Last 3 Encounters:  09/10/21 208 lb (94.3 kg)  09/06/21 207 lb 9.6 oz (94.2 kg)  04/30/21 203 lb (92.1 kg)   Pleasant, well-appearing male in good spirits. HEENT: conjunctiva and sclera are clear, EOMI Neck: no lymphadenopathy, thyromegaly or mass Heart: regular rate and rhythm Lungs: clear bilaterally, good air  movement Abdomen: soft, nontender, no mass Extremities: no edema, normal pulses Psych: normal mood, affect, hygiene and grooming Neuro: alert and oriented, cranial nerves grossly intact. Normal gait.  ASSESSMENT/PLAN:  A1c  Flu shot Did he ever check insurance re: shingrix? Has he had any COVID boosters?  RF metformin ?valtrex?  CPE 6 months ?labs prior or fasting?

## 2021-10-24 ENCOUNTER — Encounter: Payer: Self-pay | Admitting: Family Medicine

## 2021-10-24 ENCOUNTER — Other Ambulatory Visit (HOSPITAL_COMMUNITY): Payer: Self-pay

## 2021-10-24 ENCOUNTER — Ambulatory Visit: Payer: 59 | Admitting: Family Medicine

## 2021-10-24 VITALS — BP 132/82 | HR 60 | Ht 68.75 in | Wt 207.0 lb

## 2021-10-24 DIAGNOSIS — I10 Essential (primary) hypertension: Secondary | ICD-10-CM | POA: Diagnosis not present

## 2021-10-24 DIAGNOSIS — B009 Herpesviral infection, unspecified: Secondary | ICD-10-CM | POA: Diagnosis not present

## 2021-10-24 DIAGNOSIS — E119 Type 2 diabetes mellitus without complications: Secondary | ICD-10-CM

## 2021-10-24 DIAGNOSIS — Z23 Encounter for immunization: Secondary | ICD-10-CM | POA: Diagnosis not present

## 2021-10-24 LAB — POCT GLYCOSYLATED HEMOGLOBIN (HGB A1C): Hemoglobin A1C: 6.3 % — AB (ref 4.0–5.6)

## 2021-10-24 MED ORDER — VALACYCLOVIR HCL 1 G PO TABS
1000.0000 mg | ORAL_TABLET | Freq: Every day | ORAL | 3 refills | Status: DC
  Filled 2021-10-24: qty 30, 60d supply, fill #0
  Filled 2021-12-20: qty 30, 60d supply, fill #1
  Filled 2022-02-17: qty 30, 60d supply, fill #2
  Filled 2022-08-28: qty 30, 60d supply, fill #3

## 2021-10-24 MED ORDER — METFORMIN HCL ER 500 MG PO TB24
500.0000 mg | ORAL_TABLET | Freq: Every day | ORAL | 1 refills | Status: DC
  Filled 2021-10-24 – 2021-11-24 (×2): qty 90, 90d supply, fill #0
  Filled 2022-02-17: qty 90, 90d supply, fill #1

## 2021-10-24 NOTE — Patient Instructions (Addendum)
You can vary the times you check your sugars, but don't really need to be checking more than once a day (unless you find them running very high). Checking before eating (fasting, in the morning), and once a week checking 2 hours after a meal (either after lunch or dinner, or at bedtime. Normal sugar 2 hours after eating is up to 150.  You will be due for your diabetic eye exam in November.  Please have them forward a copy of their notes to [place].  I recommend getting the new shingles vaccine (Shingrix). You may want to check with your insurance to verify what your out of pocket cost may be (usually covered as preventative, but better to verify to avoid any surprises, as this vaccine is expensive), and then schedule a nurse visit at our office when convenient (based on the possible side effects as discussed).   This is a series of 2 injections, spaced 2 months apart.  It doesn't have to be exactly 2 months apart (but can't be sooner), if that isn't feasible for your schedule, but try and get them close to 2 months (and definitely within 6 months of each other, or else the efficacy of the vaccine drops off). This should be separated from other vaccines by at least 2 weeks.  Consider getting the updated COVID booster when it becomes available within the next month.

## 2021-11-01 ENCOUNTER — Encounter: Payer: 59 | Admitting: Family Medicine

## 2021-11-20 ENCOUNTER — Encounter: Payer: Self-pay | Admitting: Internal Medicine

## 2021-11-26 ENCOUNTER — Other Ambulatory Visit (HOSPITAL_COMMUNITY): Payer: Self-pay

## 2021-12-06 ENCOUNTER — Other Ambulatory Visit (HOSPITAL_COMMUNITY): Payer: Self-pay

## 2021-12-20 ENCOUNTER — Other Ambulatory Visit (HOSPITAL_COMMUNITY): Payer: Self-pay

## 2021-12-21 ENCOUNTER — Other Ambulatory Visit (HOSPITAL_COMMUNITY): Payer: Self-pay

## 2021-12-31 ENCOUNTER — Other Ambulatory Visit (HOSPITAL_COMMUNITY): Payer: Self-pay

## 2021-12-31 ENCOUNTER — Telehealth: Payer: 59 | Admitting: Family Medicine

## 2021-12-31 ENCOUNTER — Other Ambulatory Visit (INDEPENDENT_AMBULATORY_CARE_PROVIDER_SITE_OTHER): Payer: 59

## 2021-12-31 ENCOUNTER — Encounter: Payer: Self-pay | Admitting: Family Medicine

## 2021-12-31 VITALS — BP 136/80 | HR 53 | Ht 68.75 in | Wt 213.0 lb

## 2021-12-31 DIAGNOSIS — U071 COVID-19: Secondary | ICD-10-CM | POA: Diagnosis not present

## 2021-12-31 DIAGNOSIS — I10 Essential (primary) hypertension: Secondary | ICD-10-CM

## 2021-12-31 DIAGNOSIS — R059 Cough, unspecified: Secondary | ICD-10-CM | POA: Diagnosis not present

## 2021-12-31 DIAGNOSIS — J069 Acute upper respiratory infection, unspecified: Secondary | ICD-10-CM

## 2021-12-31 DIAGNOSIS — E119 Type 2 diabetes mellitus without complications: Secondary | ICD-10-CM

## 2021-12-31 LAB — POC COVID19 BINAXNOW: SARS Coronavirus 2 Ag: POSITIVE — AB

## 2021-12-31 MED ORDER — MOLNUPIRAVIR EUA 200MG CAPSULE
4.0000 | ORAL_CAPSULE | Freq: Two times a day (BID) | ORAL | 0 refills | Status: AC
  Filled 2021-12-31: qty 40, 5d supply, fill #0

## 2021-12-31 NOTE — Progress Notes (Signed)
Start time: 1:23 End time: 1:48  Virtual Visit via Video Note  I connected with Randall Calderon on 12/31/21 by a video enabled telemedicine application and verified that I am speaking with the correct person using two identifiers.  Location: Patient: home Provider: office   I discussed the limitations of evaluation and management by telemedicine and the availability of in person appointments. The patient expressed understanding and agreed to proceed.  History of Present Illness:  Chief Complaint  Patient presents with   Cough    VIRTUAL lingering cough since Friday. Stood up today and got dizzy. Blood pressure was very high-he was eating a lot of canned soup and saltine crackers. Symptoms started last Friday. Sinus pain and pressure on the left side. Dry, soreness in his mouth when he woke up this am. Has not done any home covid tests.   11/17 he felt sluggish, fatigued. By that evening he had started sneezing. 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 2 nights ago he started coughing.  He felt the worst yesterday--slight chills Wakes up with dry mouth, sore throat on the left side with swallowing, only in the morning.  He's been eating saltine crackers and canned chicken noodle soup since Friday. BP was very high earlier today, 157/98. He had some tightness in his head, and dry mouth. HA resolved with 2 excedrin.  He also reports having a dizzy spell today--when he stood up quickly to take his dish to the kitchen, he felt a "circle of whoosh", associated with congestion in L eye and cheek, pressure.  Currently he feels better, thinks he may be slightly dehydrated.  Currently he reports having a tickle in the throat, some sinus pressure.  Nasal drainage was slightly yellow at first, now is clear, but thick. Cough is nonproductive.  OTC meds taken: Took alka selzer plus this morning around 9 (acetaminophen, chlorpheniramine, dextramethorphan, phenylephrine). Then took Theraflu day pack  around 10 or 11(DM, phenylephrine, acetaminophen), and also took sudafed (pseudoephedrine)   His son had a cold, took him to the doctor on Tuesday, "common cold", doesn't think he was tested for covid or flu. His wife is now also sick.   PMH, PSH, SH reviewed  Outpatient Encounter Medications as of 12/31/2021  Medication Sig Note   Aspirin-Acetaminophen-Caffeine (EXCEDRIN PO) Take 2 tablets by mouth as needed. 12/31/2021: Last dose 9am   atorvastatin (LIPITOR) 40 MG tablet Take 1 tablet (40 mg total) by mouth daily.    DM-Phenylephrine-Acetaminophen (THERAFLU WARMING RELIEF DAY PO) Take 1 packet by mouth as needed. 12/31/2021: Around 9 or 10am   Glucose Blood (BLOOD GLUCOSE TEST STRIPS) STRP Test once daily. Pt uses one touch verio flex meter    lisinopril (ZESTRIL) 10 MG tablet Take 1 tablet (10 mg total) by mouth daily.    metFORMIN (GLUCOPHAGE-XR) 500 MG 24 hr tablet Take 1 tablet (500 mg total) by mouth daily with breakfast.    molnupiravir EUA (LAGEVRIO) 200 mg CAPS capsule Take 4 capsules (800 mg total) by mouth 2 (two) times daily for 5 days.    Multiple Vitamin (MULTIVITAMIN WITH MINERALS) TABS tablet Take 1 tablet by mouth daily.    OneTouch Delica Lancets 69V MISC 1 each by Does not apply route daily. One Touch Delica Plus Lancets, patient uses One Touch Verio Flex Meter    phenylephrine (SUDAFED PE) 10 MG TABS tablet Take 10 mg by mouth every 4 (four) hours as needed. 12/31/2021: Last dose 9am   acetaminophen (TYLENOL) 325 MG tablet Take 1,300  mg by mouth every 6 (six) hours as needed. (Patient not taking: Reported on 09/06/2021) 09/06/2021: prn   amoxicillin (AMOXIL) 500 MG capsule Take 4 capsules (2,000 mg total) by mouth 1 hour before dental procedure (Patient not taking: Reported on 10/24/2021)    FIBER PO Take 1 tablet by mouth daily. (Patient not taking: Reported on 12/31/2021)    methocarbamol (ROBAXIN) 500 MG tablet Take 1 tablet (500 mg total) by mouth 2 (two) times daily as  needed. (Patient not taking: Reported on 12/31/2021) 09/06/2021: Most days   valACYclovir (VALTREX) 1000 MG tablet Take 1 tablet (1,000 mg total) by mouth daily. Take for 5 days, starting as soon as possible with an outbreak (Patient not taking: Reported on 12/31/2021) 12/31/2021: prn   No facility-administered encounter medications on file as of 12/31/2021.   NOT taking molnupiravir prior to today's visit  No Known Allergies  ROS:  URI symptoms per HPI. HA and sinus pressure per HPI.  No n/v/d, rash, shortness of breath, chest pain or other concerns. See HPI.     Observations/Objective:  BP 136/80 Comment: 12:57  Pulse (!) 53   Ht 5' 8.75" (1.746 m)   Wt 213 lb (96.6 kg)   BMI 31.68 kg/m   Pleasant male, in no distress. He is alert and oriented. Cranial nerves are grossly intact. No coughing, throat-clearing or sniffling noted during visit. Normal speech.  Rapid COVID-19 test +  Assessment and Plan:  COVID-19 virus infection - due to DM and HTN, will treat with antiviral, molnupiravir to avoid drug interactions - Plan: molnupiravir EUA (LAGEVRIO) 200 mg CAPS capsule  Essential hypertension - BP improved now, high earlier, getting 3 types of decongestants! To stop decongestants, use Coricidin HBP instead. Monitor BP, low Na diet  Controlled type 2 diabetes mellitus without complication, without long-term current use of insulin ([place])  Stay well hydrated, drink plenty of water. Take the Mucinex 1288m 12 hour that you have at home, take this twice daily. Stop taking the sudafed, Alka selzer and the Theraflu. These all contain decongestants that will raise your blood pressure. You have been taking all 3 together, which has overlapping ingredients (the dextromethorphan and the decongestants), and is dangerous.  Use Coricidin HBP if needed for any nasal drainage or congestion. This won't raise your blood pressure. Use sinuses rinses once or twice daily (NeilMed Sinus Rinse Kit or  a Neti-Pot). Be sure to use distilled/bottled water or boiled water (not tap water)  Contact [place] you develop fever, discolored mucus or phlegm, worsening cough or shortness of breath, pain with breathing, or other concerns.    Follow Up Instructions:    I discussed the assessment and treatment plan with the patient. The patient was provided an opportunity to ask questions and all were answered. The patient agreed with the plan and demonstrated an understanding of the instructions.   The patient was advised to call back or seek an in-person evaluation if the symptoms worsen or if the condition fails to improve as anticipated.  I spent 28 minutes dedicated to the care of this patient, including pre-visit review of records, face to face time, post-visit ordering of testing and documentation.    EVikki Ports MD

## 2021-12-31 NOTE — Patient Instructions (Signed)
Stay well hydrated, drink plenty of water. Take the Mucinex 1231m 12 hour that you have at home, take this twice daily. Stop taking the sudafed, Alka selzer and the Theraflu. These all contain decongestants that will raise your blood pressure. You have been taking all 3 together, which has overlapping ingredients (the dextromethorphan and the decongestants), and is dangerous.  Use Coricidin HBP if needed for any nasal drainage or congestion. This won't raise your blood pressure. Use sinuses rinses once or twice daily (NeilMed Sinus Rinse Kit or a Neti-Pot). Be sure to use distilled/bottled water or boiled water (not tap water)  Contact u[place]if you develop fever, discolored mucus or phlegm, worsening cough or shortness of breath, pain with breathing, or other concerns.

## 2022-01-15 DIAGNOSIS — H2513 Age-related nuclear cataract, bilateral: Secondary | ICD-10-CM | POA: Diagnosis not present

## 2022-01-15 DIAGNOSIS — H43393 Other vitreous opacities, bilateral: Secondary | ICD-10-CM | POA: Diagnosis not present

## 2022-01-15 LAB — HM DIABETES EYE EXAM

## 2022-03-26 ENCOUNTER — Ambulatory Visit (INDEPENDENT_AMBULATORY_CARE_PROVIDER_SITE_OTHER): Payer: 59 | Admitting: Medical

## 2022-03-26 ENCOUNTER — Other Ambulatory Visit (HOSPITAL_COMMUNITY): Payer: Self-pay

## 2022-03-26 VITALS — BP 134/80 | HR 64 | Temp 98.2°F | Wt 220.2 lb

## 2022-03-26 DIAGNOSIS — K649 Unspecified hemorrhoids: Secondary | ICD-10-CM

## 2022-03-26 DIAGNOSIS — S39011A Strain of muscle, fascia and tendon of abdomen, initial encounter: Secondary | ICD-10-CM | POA: Diagnosis not present

## 2022-03-26 DIAGNOSIS — K921 Melena: Secondary | ICD-10-CM

## 2022-03-26 DIAGNOSIS — K5790 Diverticulosis of intestine, part unspecified, without perforation or abscess without bleeding: Secondary | ICD-10-CM | POA: Diagnosis not present

## 2022-03-26 LAB — CBC WITH DIFFERENTIAL/PLATELET
Basophils Absolute: 0 10*3/uL (ref 0.0–0.2)
Basos: 1 %
EOS (ABSOLUTE): 0.1 10*3/uL (ref 0.0–0.4)
Eos: 2 %
Hematocrit: 36.4 % — ABNORMAL LOW (ref 37.5–51.0)
Hemoglobin: 12.1 g/dL — ABNORMAL LOW (ref 13.0–17.7)
Immature Grans (Abs): 0 10*3/uL (ref 0.0–0.1)
Immature Granulocytes: 0 %
Lymphocytes Absolute: 2.5 10*3/uL (ref 0.7–3.1)
Lymphs: 61 %
MCH: 28.8 pg (ref 26.6–33.0)
MCHC: 33.2 g/dL (ref 31.5–35.7)
MCV: 87 fL (ref 79–97)
Monocytes Absolute: 0.4 10*3/uL (ref 0.1–0.9)
Monocytes: 9 %
Neutrophils Absolute: 1.1 10*3/uL — ABNORMAL LOW (ref 1.4–7.0)
Neutrophils: 27 %
Platelets: 185 10*3/uL (ref 150–450)
RBC: 4.2 x10E6/uL (ref 4.14–5.80)
RDW: 12.8 % (ref 11.6–15.4)
WBC: 4.2 10*3/uL (ref 3.4–10.8)

## 2022-03-26 MED ORDER — HYDROCORTISONE ACETATE 25 MG RE SUPP
25.0000 mg | Freq: Two times a day (BID) | RECTAL | 0 refills | Status: DC
  Filled 2022-03-26: qty 12, 6d supply, fill #0

## 2022-03-26 MED ORDER — HYDROCORTISONE 2.5 % EX CREA
TOPICAL_CREAM | Freq: Two times a day (BID) | CUTANEOUS | 1 refills | Status: DC
  Filled 2022-03-26: qty 30, 20d supply, fill #0
  Filled 2022-08-28: qty 30, 20d supply, fill #1

## 2022-03-26 NOTE — Progress Notes (Signed)
Subjective:  Randall Calderon is a 57 y.o. male who presents for Chief Complaint  Patient presents with   Rectal Bleeding    Rectal bleeding- started on cruise over the weekend. Blood was tripping on the floor and through a wash cloth. Has hemorrhoids, but not bleeding today with BM. No pain when going to bathroom,      Here for concern for blood in stool.  Here with wife today.  Occasionally in the past year or so has seen blood on tissue, particular with hemorrhoid issues.  He has some chronic hemorrhoid issues.  Has BM daily, sometimes more than once daily.  Gets hemorrhoid issues often. No dizziness, no lightheadedness.   Typically bright red.  No blood mixed in with the stool.    Over the past week was on a cruise, and last week saw blood dripping into the bowl.   He did eat a lot of spicy foods last week while he was on his cruise including habanero peppers.  In addition to blood on the toilet paper he saw blood dripping into the toilet.  Last week he was lifting a scooter into a car, leaning forward felt pain in upper abdomen.  Had pain in specific area for about 3 days then the pain resolved  No other aggravating or relieving factors.    No other c/o.   Past Medical History:  Diagnosis Date   Anemia, unspecified 10/29/2017   DM type 2 (diabetes mellitus, type 2) (HCC)    Elevated alkaline phosphatase measurement 10/29/2017   History of hemorrhoids    HSV infection 11/11/2017   HTN (hypertension)    Obesity (BMI 30.0-34.9)    Current Outpatient Medications on File Prior to Visit  Medication Sig Dispense Refill   acetaminophen (TYLENOL) 325 MG tablet Take 1,300 mg by mouth every 6 (six) hours as needed.     Aspirin-Acetaminophen-Caffeine (EXCEDRIN PO) Take 2 tablets by mouth as needed.     atorvastatin (LIPITOR) 40 MG tablet Take 1 tablet (40 mg total) by mouth daily. 90 tablet 3   Glucose Blood (BLOOD GLUCOSE TEST STRIPS) STRP Test once daily. Pt uses one touch verio flex meter 100  each 1   lisinopril (ZESTRIL) 10 MG tablet Take 1 tablet (10 mg total) by mouth daily. 90 tablet 3   metFORMIN (GLUCOPHAGE-XR) 500 MG 24 hr tablet Take 1 tablet (500 mg total) by mouth daily with breakfast. 90 tablet 1   Multiple Vitamin (MULTIVITAMIN WITH MINERALS) TABS tablet Take 1 tablet by mouth daily.     OneTouch Delica Lancets 99991111 MISC 1 each by Does not apply route daily. One Touch Delica Plus Lancets, patient uses One Touch Verio Flex Meter 100 each 3   valACYclovir (VALTREX) 1000 MG tablet Take 1 tablet (1,000 mg total) by mouth daily. Take for 5 days, starting as soon as possible with an outbreak 30 tablet 3   FIBER PO Take 1 tablet by mouth daily. (Patient not taking: Reported on 03/26/2022)     methocarbamol (ROBAXIN) 500 MG tablet Take 1 tablet (500 mg total) by mouth 2 (two) times daily as needed. (Patient not taking: Reported on 12/31/2021) 30 tablet 6   No current facility-administered medications on file prior to visit.     The following portions of the patient's history were reviewed and updated as appropriate: allergies, current medications, past family history, past medical history, past social history, past surgical history and problem list.  ROS Otherwise as in subjective above  Objective: BP  134/80   Pulse 64   Temp 98.2 F (36.8 C)   Wt 220 lb 3.2 oz (99.9 kg)   BMI 32.75 kg/m   General appearance: alert, no distress, well developed, well nourished Abdomen: +bs, soft, non tender, non distended, no masses, no hepatomegaly, no splenomegaly Pulses: 2+ radial pulses, 2+ pedal pulses, normal cap refill Ext: no edema Rectal: small external hemorrhoid on left lateral anus, no fissure, otherwise unremarkable    Assessment: Encounter Diagnoses  Name Primary?   Blood in stool Yes   Hemorrhoids, unspecified hemorrhoid type    Diverticulosis    Strain of abdominal muscle, initial encounter      Plan: Discussed concerns, likely due to chronic external  hemorrhoids.  We discussed good water intake, avoid excess loose stools or constipation.  We discussed avoiding foods that make those issues worse.  Can use prescription cream for a few days in a regular for external hemorrhoids as below, can use suppositories for a few days neuro for flareup.  Last colonoscopy 8 years ago with diverticulosis and internal hemorrhoids but given blood on the stool intermittent for the last year we will go ahead and refer back to gastroenterology now.  Labs as below.  We discussed his recent abdominal pain seems to have been a muscle strain in the abdomen that has resolved   Nthony was seen today for rectal bleeding.  Diagnoses and all orders for this visit:  Blood in stool -     CBC with Differential/Platelet -     Ambulatory referral to Gastroenterology  Hemorrhoids, unspecified hemorrhoid type -     CBC with Differential/Platelet -     Ambulatory referral to Gastroenterology  Diverticulosis -     CBC with Differential/Platelet -     Ambulatory referral to Gastroenterology  Strain of abdominal muscle, initial encounter  Other orders -     hydrocortisone 2.5 % cream; Apply topically 2 (two) times daily. -     hydrocortisone (ANUSOL-HC) 25 MG suppository; Place 1 suppository (25 mg total) rectally 2 (two) times daily.   Follow up: pending labs and referral

## 2022-03-26 NOTE — Patient Instructions (Addendum)
Hemorrhoids: We discussed exam findings and symptoms and diagnosis of hemorrhoids.  Discussed possible causes of hemorrhoids including constipation, straining, diarrhea, pregnancy, obesity, heavy lifting, and sitting for long periods.  Discussed prevention, treatment for acute flare ups, and possible complications or worsening symptoms.  Discussed hygiene, SITZ baths, diet and exercise. Rx for Hydrocortisone 2.5% cream today.  You can apply this topically to the external hemorrhoid twice daily short term for 3-5 days at a time as needed for itching, irritations, or swelling Rx for Hydrocortisone suppository (Anusol or Proctosol brand) today. You can use this twice daily per rectum short term for 3-5 days at a time for hemorrhoid flare up including itching, irritations, swelling or discomfort I have placed updated referral to gastroenterology

## 2022-03-27 ENCOUNTER — Other Ambulatory Visit (HOSPITAL_COMMUNITY): Payer: Self-pay

## 2022-03-27 NOTE — Progress Notes (Signed)
Results sent through MyChart

## 2022-04-15 DIAGNOSIS — K625 Hemorrhage of anus and rectum: Secondary | ICD-10-CM | POA: Diagnosis not present

## 2022-04-15 DIAGNOSIS — Z1211 Encounter for screening for malignant neoplasm of colon: Secondary | ICD-10-CM | POA: Diagnosis not present

## 2022-04-15 DIAGNOSIS — E119 Type 2 diabetes mellitus without complications: Secondary | ICD-10-CM | POA: Diagnosis not present

## 2022-04-16 ENCOUNTER — Other Ambulatory Visit (HOSPITAL_COMMUNITY): Payer: Self-pay

## 2022-04-25 ENCOUNTER — Encounter: Payer: 59 | Admitting: Family Medicine

## 2022-05-02 ENCOUNTER — Other Ambulatory Visit (HOSPITAL_COMMUNITY): Payer: Self-pay

## 2022-05-02 MED ORDER — CLENPIQ 10-3.5-12 MG-GM -GM/175ML PO SOLN
ORAL | 0 refills | Status: DC
Start: 2022-05-02 — End: 2022-06-03
  Filled 2022-05-02: qty 350, 1d supply, fill #0

## 2022-05-07 ENCOUNTER — Other Ambulatory Visit (HOSPITAL_COMMUNITY): Payer: Self-pay

## 2022-05-16 DIAGNOSIS — Z1211 Encounter for screening for malignant neoplasm of colon: Secondary | ICD-10-CM | POA: Diagnosis not present

## 2022-05-16 LAB — HM COLONOSCOPY

## 2022-05-17 ENCOUNTER — Encounter: Payer: Self-pay | Admitting: Internal Medicine

## 2022-05-21 ENCOUNTER — Other Ambulatory Visit (HOSPITAL_COMMUNITY): Payer: Self-pay

## 2022-06-02 NOTE — Patient Instructions (Incomplete)
  HEALTH MAINTENANCE RECOMMENDATIONS:  It is recommended that you get at least 30 minutes of aerobic exercise at least 5 days/week (for weight loss, you may need as much as 60-90 minutes). This can be any activity that gets your heart rate up. This can be divided in 10-15 minute intervals if needed, but try and build up your endurance at least once a week.  Weight bearing exercise is also recommended twice weekly.  Eat a healthy diet with lots of vegetables, fruits and fiber.  "Colorful" foods have a lot of vitamins (ie green vegetables, tomatoes, red peppers, etc).  Limit sweet tea, regular sodas and alcoholic beverages, all of which has a lot of calories and sugar.  Up to 2 alcoholic drinks daily may be beneficial for men (unless trying to lose weight, watch sugars).  Drink a lot of water.  Sunscreen of at least SPF 30 should be used on all sun-exposed parts of the skin when outside between the hours of 10 am and 4 pm (not just when at beach or pool, but even with exercise, golf, tennis, and yard work!)  Use a sunscreen that says "broad spectrum" so it covers both UVA and UVB rays, and make sure to reapply every 1-2 hours.  Remember to change the batteries in your smoke detectors when changing your clock times in the spring and fall.  Carbon monoxide detectors are recommended for your home.  Use your seat belt every time you are in a car, and please drive safely and not be distracted with cell phones and texting while driving.  Please schedule your diabetic eye exam (yearly).  I recommend getting the new shingles vaccine (Shingrix). Please schedule a nurse visit at our office when convenient (based on the possible side effects as discussed).   This is a series of 2 injections, spaced 2 months apart.  It doesn't have to be exactly 2 months apart (but can't be sooner), if that isn't feasible for your schedule, but try and get them close to 2 months (and definitely within 6 months of each other, or  else the efficacy of the vaccine drops off). This should be separated from other vaccines by at least 2 weeks.  COVID booster is also recommended (you are now 5 months since your infection).  Please try and cut out red meat (beef, pork, lamb), limit processed meats.  Really watch the sodium in your diet--your blood pressure was elevated today. Goal blood pressure is under 130/80.  If it is consistently over 135/85, please let us know. Cutting back on the salt and getting daily exercise should help your blood pressure.

## 2022-06-02 NOTE — Progress Notes (Signed)
No chief complaint on file.   Randall Calderon is a 57 y.o. male who presents for a complete physical and med check on chronic problems. He has the following concerns:  He saw Vincenza Hews with rectal bleeding, hemorrhoids.  He was referred to GI.  He underwent colonoscopy earlier this month, which was normal. Hemorrhoids? Bleeding?  Diabetes follow-up:  He reports compliance with metformin once daily, denies side effects. He tries to limit sugar and carbs in his diet. Some dietary indiscretion recently (funeral, wedding, grandkids). Last A1c was 6.3% in 10/2021. His sugars are running    No polydipsia, polyuria, hypoglycemia. He checks his feet regularly and denies concerns. Some numbness at L heel since his knee surgery.   Last diabetic eye exam was 12/2020, no retinopathy.   Hypertension:  He is compliant with lisinopril and denies side effects. Denies dizziness, headaches, chest pain, edema or shortness of breath. Doesn't normally have a lot of processed meats. Blood pressures are running   BP Readings from Last 3 Encounters:  03/26/22 134/80  12/31/21 136/80  10/24/21 132/82      Hyperlipidemia follow-up:  Patient is reportedly following a low-fat, low cholesterol diet. Compliant with medications (atorvastatin ) and denies medication side effects. Lipids were at goal on last check, due for recheck.  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   Immunization History  Administered Date(s) Administered   Influenza,inj,Quad PF,6+ Mos 11/07/2016, 10/24/2017, 11/02/2018, 11/27/2020, 10/24/2021   PFIZER(Purple Top)SARS-COV-2 Vaccination 06/19/2019, 07/17/2019   Pneumococcal Conjugate-13 04/27/2018   Tdap 10/13/2015, 08/29/2021   Zoster, Live 10/03/2015   Last colonoscopy: 05/16/22 with Dr. Elnoria Howard, normal; 10 year f/u rec.  (Prior was  07/2014--diverticulosis, internal hemorrhoids) Last PSA: Lab Results  Component Value Date   PSA1 0.8 04/26/2021   PSA1 0.7 05/05/2020   PSA1 0.7 04/29/2019   Dentist: 2-3 times/year (has dentures, with some intact teeth). Ophtho: yearly Exercise:   Exercise bike 10 minutes 3-4x/week, does some home exercises from PT (bands).    Vitamin D 30.4 in 01/2019   PMH, PSH, SH and FH reviewed and updated   ROS:  Denies anorexia, fever, weight changes, headaches, vision loss, decreased hearing, ear pain, hoarseness, chest pain, palpitations, dizziness, syncope, dyspnea on exertion, cough, swelling, nausea, vomiting, diarrhea, constipation, abdominal pain, melena, indigestion/heartburn, hematuria, incontinence, erectile dysfunction, nocturia, weakened urine stream, dysuria, genital lesions, joint pains, weakness, tremor, suspicious skin lesions, depression, anxiety, abnormal bleeding/bruising, or enlarged lymph nodes Numbness persists in L heel since knee surgery. Hemorrhoids per HPI.   PHYSICAL EXAM:  There were no vitals taken for this visit.  Wt Readings from Last 3 Encounters:  03/26/22 220 lb 3.2 oz (99.9 kg)  12/31/21 213 lb (96.6 kg)  10/24/21 207 lb (93.9 kg)   General Appearance:    Alert, cooperative, no distress, appears stated age  Head:    Normocephalic, without obvious abnormality, atraumatic  Eyes:    PERRL, conjunctiva/corneas clear, EOM's intact  Ears:    Normal TM's and external ear canals.   Nose:   No drainage or sinus tenderness  Throat:   Normal mucosa, no lesions  Neck:   Supple, no lymphadenopathy;  thyroid:  no enlargement/ tenderness/nodules; no JVD  Back:    Spine nontender, no curvature, ROM normal, no CVA tenderness  Lungs:  Clear to auscultation bilaterally without wheezes, rales or ronchi; respirations unlabored  Chest Wall:    No tenderness or deformity   Heart:    Regular rate and rhythm, S1 and S2 normal, no murmur, rub or gallop  Breast Exam:     No chest wall tenderness, masses or gynecomastia  Abdomen:     Soft, non-tender, nondistended, normoactive bowel sounds,    no masses, no hepatosplenomegaly  Genitalia:    Circumcised, no lesions, no discharge.  Testicles are descended bilaterally, no masses.  No inguinal hernias  Rectal:  Normal sphincter tone. Prostate is smooth, not enlarged, no nodules. Heme negative stool  Extremities:   No clubbing, cyanosis or edema. WHSS L knee  Pulses:   2+ and symmetric all extremities  Skin:   Skin color, texture, turgor normal, no rashes or lesions. Decreased monofilament sensation at L heel, normal elsewhere.  Lymph nodes:   Cervical, supraclavicular, and inguinal nodes normal  Neurologic:   Normal strength, sensation and gait; reflexes 2+ and symmetric throughout  Psych:            Normal mood, affect, hygiene and grooming.       ***?can decline prostate/rectal exam if no urinary symptoms (recent colonoscopy) DIABETIC FOOT EXAM--decreased sensation L heel??   ASSESSMENT/PLAN:   A1c Shingrix? Covid booster recommended (he had COVID in 12/2021)  HAS HE HAD DM EYE EXAM SINCE 12/2020??  Urine microalb, c-met, cbc, lipid, TSH, PSA  RF lipitor, lisinopril, metformin  Discussed PSA screening (risks/benefits), recommended at least 30 minutes of aerobic activity at least 5 days/week; proper sunscreen use reviewed; healthy diet and alcohol recommendations (less than or equal to 2 drinks/day) reviewed; regular seatbelt use; changing batteries in smoke detectors, carbon monoxide detectors are recommended. Immunization recommendations discussed--continue yearly flu shots.   COVID booster Shingrix recommended, risks/SE reviewed.  Declined.   Colonoscopy recommendations reviewed, UTD.  F/u 6 months for med check

## 2022-06-03 ENCOUNTER — Ambulatory Visit (INDEPENDENT_AMBULATORY_CARE_PROVIDER_SITE_OTHER): Payer: 59 | Admitting: Family Medicine

## 2022-06-03 ENCOUNTER — Encounter: Payer: Self-pay | Admitting: Family Medicine

## 2022-06-03 ENCOUNTER — Other Ambulatory Visit (HOSPITAL_COMMUNITY): Payer: Self-pay

## 2022-06-03 VITALS — BP 134/88 | HR 76 | Ht 68.0 in | Wt 214.2 lb

## 2022-06-03 DIAGNOSIS — E785 Hyperlipidemia, unspecified: Secondary | ICD-10-CM

## 2022-06-03 DIAGNOSIS — B009 Herpesviral infection, unspecified: Secondary | ICD-10-CM | POA: Diagnosis not present

## 2022-06-03 DIAGNOSIS — E1169 Type 2 diabetes mellitus with other specified complication: Secondary | ICD-10-CM

## 2022-06-03 DIAGNOSIS — I152 Hypertension secondary to endocrine disorders: Secondary | ICD-10-CM | POA: Diagnosis not present

## 2022-06-03 DIAGNOSIS — E118 Type 2 diabetes mellitus with unspecified complications: Secondary | ICD-10-CM

## 2022-06-03 DIAGNOSIS — Z125 Encounter for screening for malignant neoplasm of prostate: Secondary | ICD-10-CM | POA: Diagnosis not present

## 2022-06-03 DIAGNOSIS — Z0000 Encounter for general adult medical examination without abnormal findings: Secondary | ICD-10-CM

## 2022-06-03 DIAGNOSIS — I10 Essential (primary) hypertension: Secondary | ICD-10-CM | POA: Diagnosis not present

## 2022-06-03 DIAGNOSIS — E1159 Type 2 diabetes mellitus with other circulatory complications: Secondary | ICD-10-CM | POA: Diagnosis not present

## 2022-06-03 DIAGNOSIS — K648 Other hemorrhoids: Secondary | ICD-10-CM | POA: Diagnosis not present

## 2022-06-03 LAB — POCT URINALYSIS DIP (PROADVANTAGE DEVICE)
Bilirubin, UA: NEGATIVE
Blood, UA: NEGATIVE
Glucose, UA: NEGATIVE mg/dL
Ketones, POC UA: NEGATIVE mg/dL
Leukocytes, UA: NEGATIVE
Nitrite, UA: NEGATIVE
Protein Ur, POC: NEGATIVE mg/dL
Specific Gravity, Urine: 1.005
Urobilinogen, Ur: 0.2
pH, UA: 6.5 (ref 5.0–8.0)

## 2022-06-03 LAB — POCT GLYCOSYLATED HEMOGLOBIN (HGB A1C): Hemoglobin A1C: 6.6 % — AB (ref 4.0–5.6)

## 2022-06-03 MED ORDER — HYDROCORTISONE ACETATE 25 MG RE SUPP
25.0000 mg | Freq: Two times a day (BID) | RECTAL | 0 refills | Status: DC
  Filled 2022-06-03: qty 12, 6d supply, fill #0

## 2022-06-03 MED ORDER — LISINOPRIL 10 MG PO TABS
10.0000 mg | ORAL_TABLET | Freq: Every day | ORAL | 1 refills | Status: DC
  Filled 2022-06-03: qty 90, 90d supply, fill #0
  Filled 2022-08-28: qty 90, 90d supply, fill #1

## 2022-06-03 MED ORDER — ATORVASTATIN CALCIUM 40 MG PO TABS
40.0000 mg | ORAL_TABLET | Freq: Every day | ORAL | 3 refills | Status: DC
  Filled 2022-06-03 – 2022-07-11 (×2): qty 90, 90d supply, fill #0
  Filled 2022-10-22: qty 90, 90d supply, fill #1

## 2022-06-03 MED ORDER — METFORMIN HCL ER 500 MG PO TB24
500.0000 mg | ORAL_TABLET | Freq: Every day | ORAL | 1 refills | Status: DC
  Filled 2022-06-03: qty 90, 90d supply, fill #0
  Filled 2022-08-28: qty 90, 90d supply, fill #1

## 2022-06-04 LAB — CBC WITH DIFFERENTIAL/PLATELET
Basophils Absolute: 0 10*3/uL (ref 0.0–0.2)
Basos: 1 %
EOS (ABSOLUTE): 0.1 10*3/uL (ref 0.0–0.4)
Eos: 2 %
Hematocrit: 39.2 % (ref 37.5–51.0)
Hemoglobin: 13 g/dL (ref 13.0–17.7)
Immature Grans (Abs): 0 10*3/uL (ref 0.0–0.1)
Immature Granulocytes: 0 %
Lymphocytes Absolute: 2.5 10*3/uL (ref 0.7–3.1)
Lymphs: 55 %
MCH: 29.1 pg (ref 26.6–33.0)
MCHC: 33.2 g/dL (ref 31.5–35.7)
MCV: 88 fL (ref 79–97)
Monocytes Absolute: 0.3 10*3/uL (ref 0.1–0.9)
Monocytes: 8 %
Neutrophils Absolute: 1.6 10*3/uL (ref 1.4–7.0)
Neutrophils: 34 %
Platelets: 219 10*3/uL (ref 150–450)
RBC: 4.46 x10E6/uL (ref 4.14–5.80)
RDW: 12.4 % (ref 11.6–15.4)
WBC: 4.6 10*3/uL (ref 3.4–10.8)

## 2022-06-04 LAB — COMPREHENSIVE METABOLIC PANEL
ALT: 30 IU/L (ref 0–44)
AST: 24 IU/L (ref 0–40)
Albumin/Globulin Ratio: 1.5 (ref 1.2–2.2)
Albumin: 4.8 g/dL (ref 3.8–4.9)
Alkaline Phosphatase: 200 IU/L — ABNORMAL HIGH (ref 44–121)
BUN/Creatinine Ratio: 8 — ABNORMAL LOW (ref 9–20)
BUN: 10 mg/dL (ref 6–24)
Bilirubin Total: 0.4 mg/dL (ref 0.0–1.2)
CO2: 23 mmol/L (ref 20–29)
Calcium: 9.8 mg/dL (ref 8.7–10.2)
Chloride: 102 mmol/L (ref 96–106)
Creatinine, Ser: 1.19 mg/dL (ref 0.76–1.27)
Globulin, Total: 3.1 g/dL (ref 1.5–4.5)
Glucose: 100 mg/dL — ABNORMAL HIGH (ref 70–99)
Potassium: 4.4 mmol/L (ref 3.5–5.2)
Sodium: 138 mmol/L (ref 134–144)
Total Protein: 7.9 g/dL (ref 6.0–8.5)
eGFR: 71 mL/min/{1.73_m2} (ref >59)

## 2022-06-04 LAB — LIPID PANEL
Chol/HDL Ratio: 3 ratio (ref 0.0–5.0)
Cholesterol, Total: 135 mg/dL (ref 100–199)
HDL: 45 mg/dL (ref >39)
LDL Chol Calc (NIH): 79 mg/dL (ref 0–99)
Triglycerides: 52 mg/dL (ref 0–149)
VLDL Cholesterol Cal: 11 mg/dL (ref 5–40)

## 2022-06-04 LAB — PSA: Prostate Specific Ag, Serum: 0.7 ng/mL (ref 0.0–4.0)

## 2022-06-04 LAB — TSH: TSH: 1.14 u[IU]/mL (ref 0.450–4.500)

## 2022-06-04 LAB — MICROALBUMIN / CREATININE URINE RATIO
Creatinine, Urine: 19.4 mg/dL
Microalb/Creat Ratio: <15 mg/g creat (ref 0–29)
Microalbumin, Urine: <3 ug/mL

## 2022-07-11 ENCOUNTER — Other Ambulatory Visit (HOSPITAL_COMMUNITY): Payer: Self-pay

## 2022-08-28 ENCOUNTER — Encounter: Payer: Self-pay | Admitting: *Deleted

## 2022-08-28 DIAGNOSIS — E119 Type 2 diabetes mellitus without complications: Secondary | ICD-10-CM | POA: Diagnosis not present

## 2022-08-28 DIAGNOSIS — H2513 Age-related nuclear cataract, bilateral: Secondary | ICD-10-CM | POA: Diagnosis not present

## 2022-10-24 ENCOUNTER — Other Ambulatory Visit (HOSPITAL_COMMUNITY): Payer: Self-pay

## 2022-11-25 ENCOUNTER — Other Ambulatory Visit (HOSPITAL_COMMUNITY): Payer: Self-pay

## 2022-11-25 ENCOUNTER — Other Ambulatory Visit: Payer: Self-pay | Admitting: Family Medicine

## 2022-11-25 DIAGNOSIS — K648 Other hemorrhoids: Secondary | ICD-10-CM

## 2022-11-25 MED ORDER — HYDROCORTISONE ACETATE 25 MG RE SUPP
25.0000 mg | Freq: Two times a day (BID) | RECTAL | 0 refills | Status: DC
  Filled 2022-11-25: qty 12, 6d supply, fill #0

## 2022-11-25 NOTE — Telephone Encounter (Signed)
Patient did ask for a refill, having a flare and did confirm appt for med check on 12/05/22.

## 2022-11-29 ENCOUNTER — Other Ambulatory Visit (HOSPITAL_COMMUNITY): Payer: Self-pay

## 2022-12-02 ENCOUNTER — Other Ambulatory Visit (HOSPITAL_COMMUNITY): Payer: Self-pay

## 2022-12-02 MED ORDER — AMOXICILLIN 500 MG PO CAPS
2000.0000 mg | ORAL_CAPSULE | ORAL | 1 refills | Status: DC
  Filled 2022-12-02: qty 12, 3d supply, fill #0
  Filled 2023-01-17: qty 12, 3d supply, fill #1

## 2022-12-04 ENCOUNTER — Encounter: Payer: 59 | Admitting: Family Medicine

## 2022-12-04 NOTE — Patient Instructions (Incomplete)

## 2022-12-04 NOTE — Progress Notes (Unsigned)
No chief complaint on file.  Patient presents for 6 month follow-up on chronic conditions  Hemorrhoids---suppositories helped more than the creams.  He is taking fiber supplement. Bleeding/straining?   Diabetes follow-up:  He reports compliance with metformin once daily, denies side effects. He tries to limit sugar and carbs in his diet.  Last A1c was 6.6% in  in 05/2022 (up from 6.3 in 10/2021, had been on cruise [place] birthday prior to visit). His sugars are running 110-120's most of the time. No polydipsia, polyuria, hypoglycemia. He checks his feet regularly and denies concerns. Some numbness at L heel since his knee surgery, but reports this has improved some.  Last diabetic eye exam was 01/2022, no retinopathy.    Hypertension:  He is compliant with lisinopril and denies side effects. Denies dizziness, headaches, chest pain, edema or shortness of breath. Doesn't normally have a lot of processed meats. At last visit he had reported he started eating corned beef again, having it once weekly for a month prior to visit.   ***  He hasn't been monitoring his BP at home.   BP Readings from Last 3 Encounters:  06/03/22 134/88  03/26/22 134/80  12/31/21 136/80      Hyperlipidemia follow-up:  Patient is reportedly following a low-fat, low cholesterol diet. Compliant with medications (atorvastatin 40mg ) and denies medication side effects.  Lipids were at goal on last check on this regimen.  Lab Results  Component Value Date   CHOL 135 06/03/2022   HDL 45 06/03/2022   LDLCALC 79 06/03/2022   TRIG 52 06/03/2022   CHOLHDL 3.0 06/03/2022    PMH, PSH, SH reviewed    ROS:  no fever, chills, URI symptoms, headaches, dizziness, chest pain, palpitations, shortness of breath, edema. Moods are good. No bleeding, bruising, rash, lesions. Hemorrhoids? Genital HSV? Lateral L kne pain?   PHYSICAL EXAM:  There were no vitals taken for this visit.  Wt Readings from Last 3  Encounters:  06/03/22 214 lb 3.2 oz (97.2 kg)  03/26/22 220 lb 3.2 oz (99.9 kg)  12/31/21 213 lb (96.6 kg)   Pleasant, well-appearing male in good spirits. HEENT: conjunctiva and sclera are clear, EOMI Neck: no lymphadenopathy, thyromegaly or mass Heart: regular rate and rhythm Lungs: clear bilaterally, good air movement Abdomen: soft, nontender, no mass Extremities: normal pulses, no edema. Psych: normal mood, affect, hygiene and grooming Neuro: alert and oriented, cranial nerves grossly intact. Normal gait.     ASSESSMENT/PLAN:  A1c LFT's  RF lisinopril, metformin. ?need valtrex?  Flu Offer/decline COVID Shingrix recommended--can offer today if not getting COVID   H/o elevated LFT's, and elevated alk phos on last check.  Schedule CPE 6 months. ?labs prior?? Or fasting visit  LFT's today

## 2022-12-05 ENCOUNTER — Other Ambulatory Visit (HOSPITAL_COMMUNITY): Payer: Self-pay

## 2022-12-05 ENCOUNTER — Encounter: Payer: Self-pay | Admitting: Family Medicine

## 2022-12-05 ENCOUNTER — Ambulatory Visit (INDEPENDENT_AMBULATORY_CARE_PROVIDER_SITE_OTHER): Payer: 59 | Admitting: Family Medicine

## 2022-12-05 ENCOUNTER — Other Ambulatory Visit: Payer: Self-pay

## 2022-12-05 VITALS — BP 126/86 | HR 64 | Ht 68.0 in | Wt 206.4 lb

## 2022-12-05 DIAGNOSIS — I10 Essential (primary) hypertension: Secondary | ICD-10-CM | POA: Diagnosis not present

## 2022-12-05 DIAGNOSIS — R748 Abnormal levels of other serum enzymes: Secondary | ICD-10-CM | POA: Diagnosis not present

## 2022-12-05 DIAGNOSIS — Z96652 Presence of left artificial knee joint: Secondary | ICD-10-CM

## 2022-12-05 DIAGNOSIS — E1159 Type 2 diabetes mellitus with other circulatory complications: Secondary | ICD-10-CM | POA: Diagnosis not present

## 2022-12-05 DIAGNOSIS — E118 Type 2 diabetes mellitus with unspecified complications: Secondary | ICD-10-CM | POA: Diagnosis not present

## 2022-12-05 DIAGNOSIS — I152 Hypertension secondary to endocrine disorders: Secondary | ICD-10-CM

## 2022-12-05 DIAGNOSIS — E1169 Type 2 diabetes mellitus with other specified complication: Secondary | ICD-10-CM

## 2022-12-05 DIAGNOSIS — E785 Hyperlipidemia, unspecified: Secondary | ICD-10-CM

## 2022-12-05 DIAGNOSIS — Z23 Encounter for immunization: Secondary | ICD-10-CM | POA: Diagnosis not present

## 2022-12-05 LAB — POCT GLYCOSYLATED HEMOGLOBIN (HGB A1C): Hemoglobin A1C: 6.3 % — AB (ref 4.0–5.6)

## 2022-12-05 MED ORDER — ATORVASTATIN CALCIUM 40 MG PO TABS
40.0000 mg | ORAL_TABLET | Freq: Every day | ORAL | 1 refills | Status: DC
  Filled 2022-12-05 – 2023-01-17 (×2): qty 90, 90d supply, fill #0
  Filled 2023-03-16 – 2023-04-13 (×2): qty 90, 90d supply, fill #1

## 2022-12-05 MED ORDER — METFORMIN HCL ER 500 MG PO TB24
500.0000 mg | ORAL_TABLET | Freq: Every day | ORAL | 1 refills | Status: DC
  Filled 2022-12-05: qty 90, 90d supply, fill #0
  Filled 2023-03-16: qty 90, 90d supply, fill #1

## 2022-12-05 MED ORDER — LISINOPRIL 10 MG PO TABS
10.0000 mg | ORAL_TABLET | Freq: Every day | ORAL | 1 refills | Status: DC
  Filled 2022-12-05: qty 90, 90d supply, fill #0
  Filled 2023-03-16: qty 90, 90d supply, fill #1

## 2022-12-06 LAB — HEPATIC FUNCTION PANEL
ALT: 22 [IU]/L (ref 0–44)
AST: 22 [IU]/L (ref 0–40)
Albumin: 4.5 g/dL (ref 3.8–4.9)
Alkaline Phosphatase: 187 [IU]/L — ABNORMAL HIGH (ref 44–121)
Bilirubin Total: 0.5 mg/dL (ref 0.0–1.2)
Bilirubin, Direct: 0.16 mg/dL (ref 0.00–0.40)
Total Protein: 7.3 g/dL (ref 6.0–8.5)

## 2022-12-10 ENCOUNTER — Other Ambulatory Visit: Payer: Self-pay | Admitting: Orthopaedic Surgery

## 2022-12-13 ENCOUNTER — Other Ambulatory Visit: Payer: Self-pay | Admitting: Physician Assistant

## 2022-12-13 ENCOUNTER — Telehealth: Payer: Self-pay | Admitting: Orthopaedic Surgery

## 2022-12-13 NOTE — Telephone Encounter (Signed)
Cannot refill muscle relaxer as we have not seen him in well over a year

## 2022-12-13 NOTE — Telephone Encounter (Signed)
Patient  called needing Rx refilled Methocarbamol. The number contact patient is [phone]

## 2022-12-13 NOTE — Telephone Encounter (Signed)
Called and notified via voicemail

## 2022-12-20 ENCOUNTER — Ambulatory Visit: Payer: 59 | Admitting: Orthopaedic Surgery

## 2022-12-24 ENCOUNTER — Other Ambulatory Visit (HOSPITAL_COMMUNITY): Payer: Self-pay

## 2023-01-12 IMAGING — DX DG KNEE 1-2V PORT*L*
2 series · 2 of 2 positions shown · non-contrast
Comparison: 09/11/2020

CLINICAL DATA: Status post left total knee arthroplasty.

EXAM:
PORTABLE LEFT KNEE - 1-2 VIEW

[knee ap]
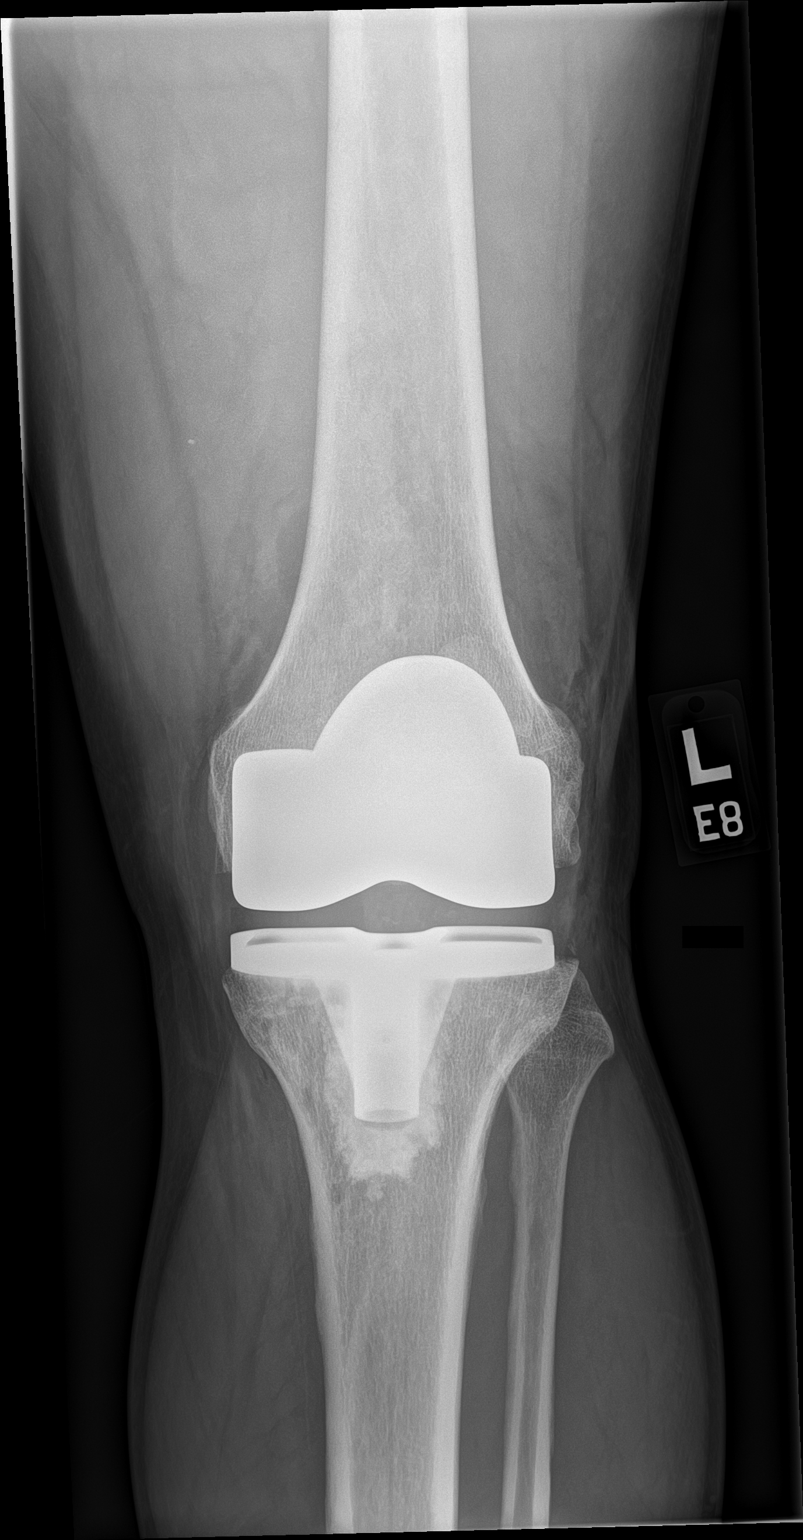

[knee lat]
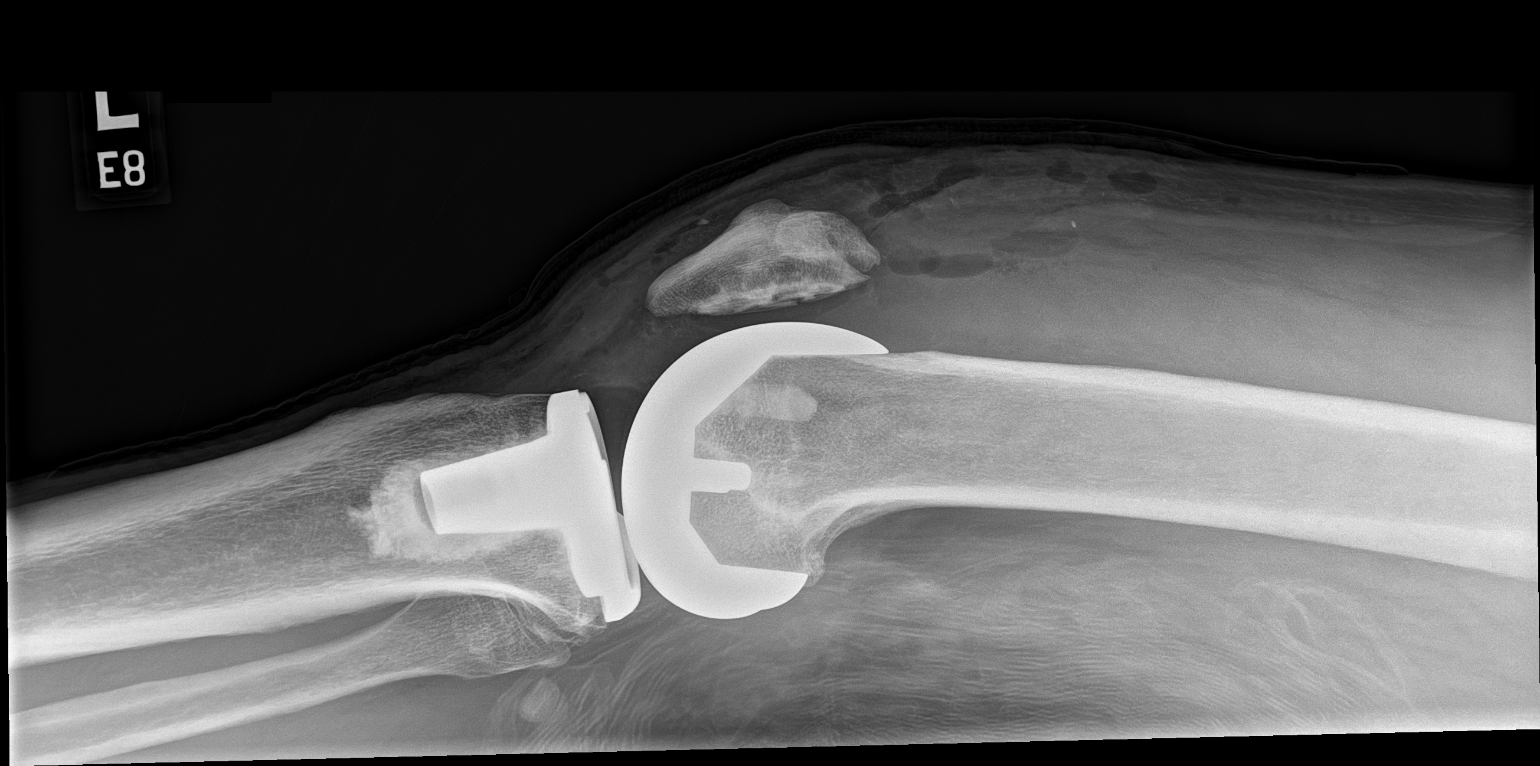

[2 of 2 positions shown; findings below may reference images not displayed]

FINDINGS: Postoperative changes from left total knee arthroplasty. Hardware
components are in anatomic alignment. No periprosthetic fracture or
dislocation. Soft tissue gas and swelling noted within the
suprapatellar joint space and anterior soft tissues.
IMPRESSION: Status post left total knee arthroplasty.

## 2023-01-17 ENCOUNTER — Other Ambulatory Visit (HOSPITAL_COMMUNITY): Payer: Self-pay

## 2023-02-22 ENCOUNTER — Other Ambulatory Visit: Payer: Self-pay | Admitting: Family Medicine

## 2023-02-22 DIAGNOSIS — K648 Other hemorrhoids: Secondary | ICD-10-CM

## 2023-02-24 ENCOUNTER — Other Ambulatory Visit (HOSPITAL_COMMUNITY): Payer: Self-pay

## 2023-02-24 MED ORDER — HYDROCORTISONE ACETATE 25 MG RE SUPP
25.0000 mg | Freq: Two times a day (BID) | RECTAL | 0 refills | Status: DC
  Filled 2023-02-24: qty 12, 6d supply, fill #0

## 2023-02-24 NOTE — Telephone Encounter (Signed)
 Is this okay to refill?

## 2023-03-17 ENCOUNTER — Other Ambulatory Visit: Payer: Self-pay

## 2023-06-10 ENCOUNTER — Other Ambulatory Visit: Payer: Self-pay | Admitting: Family Medicine

## 2023-06-10 DIAGNOSIS — E118 Type 2 diabetes mellitus with unspecified complications: Secondary | ICD-10-CM

## 2023-06-10 DIAGNOSIS — I10 Essential (primary) hypertension: Secondary | ICD-10-CM

## 2023-06-10 NOTE — Telephone Encounter (Signed)
 Copied from CRM [id number]. Topic: Clinical - Medication Refill >> Jun 10, 2023 10:52 AM Alpha Arts wrote: Most Recent Primary Care Visit:  Provider: KNAPP, EVE  Department: [facility]  Visit Type: MED MGMT 30  Date: 12/05/2022  Medication: metFORMIN  (GLUCOPHAGE -XR) 500 MG 24 hr tablet  lisinopril  (ZESTRIL ) 10 MG tablet   Has the patient contacted their pharmacy? Yes (Agent: If no, request that the patient contact the pharmacy for the refill. If patient does not wish to contact the pharmacy document the reason why and proceed with request.) (Agent: If yes, when and what did the pharmacy advise?)  Is this the correct pharmacy for this prescription? Yes If no, delete pharmacy and type the correct one.  This is the patient's preferred pharmacy:  [facility] [address], [address] [place] [address] Phone: [phone] Fax: [phone]   Has the prescription been filled recently? Yes  Is the patient out of the medication? Yes  Has the patient been seen for an appointment in the last year OR does the patient have an upcoming appointment? Yes  Can we respond through MyChart? Yes  Agent: Please be advised that Rx refills may take up to 3 business days. We ask that you follow-up with your pharmacy.

## 2023-06-12 ENCOUNTER — Other Ambulatory Visit (HOSPITAL_COMMUNITY): Payer: Self-pay

## 2023-06-12 MED ORDER — LISINOPRIL 10 MG PO TABS
10.0000 mg | ORAL_TABLET | Freq: Every day | ORAL | 0 refills | Status: DC
  Filled 2023-06-12: qty 90, 90d supply, fill #0

## 2023-06-12 MED ORDER — METFORMIN HCL ER 500 MG PO TB24
500.0000 mg | ORAL_TABLET | Freq: Every day | ORAL | 0 refills | Status: DC
  Filled 2023-06-12: qty 90, 90d supply, fill #0

## 2023-06-18 ENCOUNTER — Encounter: Payer: 59 | Admitting: Family Medicine

## 2023-08-03 ENCOUNTER — Other Ambulatory Visit: Payer: Self-pay | Admitting: Family Medicine

## 2023-08-03 DIAGNOSIS — E118 Type 2 diabetes mellitus with unspecified complications: Secondary | ICD-10-CM

## 2023-08-07 ENCOUNTER — Telehealth: Payer: Self-pay | Admitting: *Deleted

## 2023-08-07 ENCOUNTER — Other Ambulatory Visit: Payer: Self-pay | Admitting: *Deleted

## 2023-08-07 ENCOUNTER — Other Ambulatory Visit (HOSPITAL_COMMUNITY): Payer: Self-pay

## 2023-08-07 DIAGNOSIS — E118 Type 2 diabetes mellitus with unspecified complications: Secondary | ICD-10-CM

## 2023-08-07 MED ORDER — ATORVASTATIN CALCIUM 40 MG PO TABS
40.0000 mg | ORAL_TABLET | Freq: Every day | ORAL | 0 refills | Status: DC
  Filled 2023-08-07: qty 90, 90d supply, fill #0

## 2023-08-07 NOTE — Telephone Encounter (Signed)
 Copied from CRM [id number]. Topic: Clinical - Prescription Issue >> Aug 07, 2023  9:58 AM Thersia BROCKS wrote: Reason for CRM: Patient wife called in regarding the prescription atorvastatin  (LIPITOR) 40 MG tablet , stated it was ordered on mychart at 06/22 and it still hasn't been received by the pharmacy yet, would like a callback regarding the status   Called wife and refilled medication.

## 2023-08-08 NOTE — Telephone Encounter (Signed)
Called pt left message sent mychart message

## 2023-08-11 ENCOUNTER — Encounter: Admitting: Family Medicine

## 2023-09-12 ENCOUNTER — Encounter: Admitting: Family Medicine

## 2023-09-26 ENCOUNTER — Encounter: Payer: Self-pay | Admitting: Family Medicine

## 2023-09-26 ENCOUNTER — Ambulatory Visit: Admitting: Family Medicine

## 2023-09-26 ENCOUNTER — Other Ambulatory Visit (HOSPITAL_COMMUNITY): Payer: Self-pay

## 2023-09-26 VITALS — BP 128/70 | HR 60 | Ht 69.0 in | Wt 201.0 lb

## 2023-09-26 DIAGNOSIS — E118 Type 2 diabetes mellitus with unspecified complications: Secondary | ICD-10-CM

## 2023-09-26 DIAGNOSIS — K648 Other hemorrhoids: Secondary | ICD-10-CM | POA: Diagnosis not present

## 2023-09-26 DIAGNOSIS — Z0000 Encounter for general adult medical examination without abnormal findings: Secondary | ICD-10-CM | POA: Diagnosis not present

## 2023-09-26 DIAGNOSIS — I10 Essential (primary) hypertension: Secondary | ICD-10-CM

## 2023-09-26 LAB — POCT GLYCOSYLATED HEMOGLOBIN (HGB A1C): Hemoglobin A1C: 6.3 % — AB (ref 4.0–5.6)

## 2023-09-26 MED ORDER — ATORVASTATIN CALCIUM 40 MG PO TABS
40.0000 mg | ORAL_TABLET | Freq: Every day | ORAL | 1 refills | Status: DC
  Filled 2023-09-26 – 2023-10-20 (×4): qty 90, 90d supply, fill #0

## 2023-09-26 MED ORDER — METFORMIN HCL ER 500 MG PO TB24
500.0000 mg | ORAL_TABLET | Freq: Every day | ORAL | 0 refills | Status: DC
  Filled 2023-09-26: qty 90, 90d supply, fill #0

## 2023-09-26 MED ORDER — HYDROCORTISONE ACETATE 25 MG RE SUPP
25.0000 mg | Freq: Two times a day (BID) | RECTAL | 0 refills | Status: AC
  Filled 2023-09-26: qty 12, 6d supply, fill #0

## 2023-09-26 MED ORDER — LISINOPRIL 10 MG PO TABS
10.0000 mg | ORAL_TABLET | Freq: Every day | ORAL | 1 refills | Status: DC
  Filled 2023-09-26: qty 90, 90d supply, fill #0
  Filled 2023-12-24: qty 90, 90d supply, fill #1

## 2023-09-26 NOTE — Progress Notes (Signed)
 Name: Randall Calderon   Date of Visit: 09/26/23   Date of last visit with me: Visit date not found   CHIEF COMPLAINT:  Chief Complaint  Patient presents with   other    fasting       HPI:  Discussed the use of AI scribe software for clinical note transcription with the patient, who gave verbal consent to proceed.  History of Present Illness   Randall Calderon is a 58 year old male with diabetes who presents for an established care visit and medication refill.  He is running low on his current medications and wishes to synchronize his refills to avoid multiple pharmacy visits. He is currently on metformin . His last A1c was 6.3, and he is actively trying to reduce his weight to below 200 pounds, currently weighing 201 pounds.  He received the first shingles vaccine in October of last year but has not yet received the second dose. He does not typically receive the flu vaccine and has not had the pneumococcal vaccine.  His alkaline phosphatase levels have been consistently high. He has not experienced any significant unintentional weight loss.  He has a family history of cardiac conditions, with his mother having had heart valves replaced around his current age.         OBJECTIVE:       09/26/2023   10:09 AM  Depression screen PHQ 2/9  Decreased Interest 0  Down, Depressed, Hopeless 0  PHQ - 2 Score 0     BP Readings from Last 3 Encounters:  09/26/23 128/70  12/05/22 126/86  06/03/22 134/88    BP 128/70   Pulse 60   Ht 5' 9 (1.753 m)   Wt 201 lb (91.2 kg)   SpO2 97%   BMI 29.68 kg/m    Physical Exam          Physical Exam Constitutional:      Appearance: Normal appearance.  Cardiovascular:     Rate and Rhythm: Normal rate and regular rhythm.  Skin:    Capillary Refill: Capillary refill takes less than 2 seconds.  Neurological:     General: No focal deficit present.     Mental Status: He is alert and oriented to person, place, and time. Mental status is  at baseline.     ASSESSMENT/PLAN:   Assessment & Plan Annual physical exam  Controlled type 2 diabetes mellitus with complication, without long-term current use of insulin  (HCC)  Essential hypertension  Controlled type 2 diabetes mellitus with complication, without long-term current use of insulin  (HCC)  Internal hemorrhoid    Assessment and Plan    Type 2 diabetes mellitus Type 2 diabetes mellitus with previous A1c of 6.3. Managed with metformin . No significant symptoms. - Perform A1c test today. - Consider discontinuing metformin  if A1c is normal and weight loss continues. - Schedule follow-up in 3 months for repeat A1c and weight check. - CMP and cholesterol labs ordered.   Overweight Current weight 201 lbs with goal to reduce below 200 lbs. Engaged in weight loss efforts. - Encourage continued weight loss efforts.  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 Previous discussions with Dr. Randol. No significant symptoms. Screening recommended to rule out underlying disease. - Perform basic labs including liver function tests. - Consider liver ultrasound if alkaline phosphatase remains elevated. - PSA ordered.  General Health Maintenance Discussion of vaccinations including shingles, flu, and pneumococcal. Shingles vaccine series incomplete. No pneumococcal vaccination history. Flu vaccine not routinely received. -  Schedule nurse visit for second shingles vaccine. - Consider pneumococcal vaccine at next year's physical. - Offer flu vaccine when available in September.         Randall Calderon A. Vita MD [facility]

## 2023-09-27 LAB — COMPREHENSIVE METABOLIC PANEL WITH GFR
ALT: 19 IU/L (ref 0–44)
AST: 23 IU/L (ref 0–40)
Albumin: 4.7 g/dL (ref 3.8–4.9)
Alkaline Phosphatase: 186 IU/L — ABNORMAL HIGH (ref 44–121)
BUN/Creatinine Ratio: 8 — ABNORMAL LOW (ref 9–20)
BUN: 8 mg/dL (ref 6–24)
Bilirubin Total: 0.5 mg/dL (ref 0.0–1.2)
CO2: 18 mmol/L — ABNORMAL LOW (ref 20–29)
Calcium: 9.5 mg/dL (ref 8.7–10.2)
Chloride: 103 mmol/L (ref 96–106)
Creatinine, Ser: 0.97 mg/dL (ref 0.76–1.27)
Globulin, Total: 2.7 g/dL (ref 1.5–4.5)
Glucose: 110 mg/dL — ABNORMAL HIGH (ref 70–99)
Potassium: 4.5 mmol/L (ref 3.5–5.2)
Sodium: 137 mmol/L (ref 134–144)
Total Protein: 7.4 g/dL (ref 6.0–8.5)
eGFR: 90 mL/min/1.73 (ref >59)

## 2023-09-27 LAB — LIPID PANEL
Chol/HDL Ratio: 3 ratio (ref 0.0–5.0)
Cholesterol, Total: 138 mg/dL (ref 100–199)
HDL: 46 mg/dL (ref >39)
LDL Chol Calc (NIH): 79 mg/dL (ref 0–99)
Triglycerides: 62 mg/dL (ref 0–149)
VLDL Cholesterol Cal: 13 mg/dL (ref 5–40)

## 2023-09-27 LAB — PSA: Prostate Specific Ag, Serum: 1 ng/mL (ref 0.0–4.0)

## 2023-09-30 ENCOUNTER — Other Ambulatory Visit: Payer: Self-pay

## 2023-10-04 ENCOUNTER — Other Ambulatory Visit: Payer: Self-pay

## 2023-10-08 ENCOUNTER — Other Ambulatory Visit (HOSPITAL_COMMUNITY): Payer: Self-pay

## 2023-10-08 ENCOUNTER — Encounter (HOSPITAL_COMMUNITY): Payer: Self-pay

## 2023-10-20 ENCOUNTER — Other Ambulatory Visit (HOSPITAL_COMMUNITY): Payer: Self-pay

## 2023-10-22 ENCOUNTER — Other Ambulatory Visit (HOSPITAL_COMMUNITY): Payer: Self-pay

## 2023-10-23 ENCOUNTER — Other Ambulatory Visit: Payer: Self-pay | Admitting: Family Medicine

## 2023-10-23 DIAGNOSIS — B009 Herpesviral infection, unspecified: Secondary | ICD-10-CM

## 2023-10-24 ENCOUNTER — Other Ambulatory Visit (HOSPITAL_COMMUNITY): Payer: Self-pay

## 2023-10-24 MED ORDER — VALACYCLOVIR HCL 1 G PO TABS
1000.0000 mg | ORAL_TABLET | Freq: Every day | ORAL | 3 refills | Status: AC
  Filled 2023-10-24: qty 30, 30d supply, fill #0
  Filled 2023-12-22: qty 30, 30d supply, fill #1
  Filled 2024-01-28: qty 30, 30d supply, fill #2

## 2023-11-01 DIAGNOSIS — H524 Presbyopia: Secondary | ICD-10-CM | POA: Diagnosis not present

## 2023-11-01 DIAGNOSIS — E119 Type 2 diabetes mellitus without complications: Secondary | ICD-10-CM | POA: Diagnosis not present

## 2023-11-01 LAB — OPHTHALMOLOGY REPORT-SCANNED

## 2023-12-15 ENCOUNTER — Encounter: Payer: Self-pay | Admitting: Radiology

## 2023-12-22 ENCOUNTER — Other Ambulatory Visit (HOSPITAL_COMMUNITY): Payer: Self-pay

## 2023-12-22 ENCOUNTER — Other Ambulatory Visit: Payer: Self-pay

## 2023-12-22 ENCOUNTER — Other Ambulatory Visit: Payer: Self-pay | Admitting: Family Medicine

## 2023-12-22 DIAGNOSIS — E118 Type 2 diabetes mellitus with unspecified complications: Secondary | ICD-10-CM

## 2023-12-22 DIAGNOSIS — Z0000 Encounter for general adult medical examination without abnormal findings: Secondary | ICD-10-CM

## 2023-12-22 MED ORDER — METFORMIN HCL ER 500 MG PO TB24
500.0000 mg | ORAL_TABLET | Freq: Every day | ORAL | 0 refills | Status: DC
  Filled 2023-12-22: qty 90, 90d supply, fill #0

## 2023-12-29 ENCOUNTER — Encounter: Payer: Self-pay | Admitting: Family Medicine

## 2023-12-29 ENCOUNTER — Other Ambulatory Visit (HOSPITAL_COMMUNITY): Payer: Self-pay

## 2023-12-29 ENCOUNTER — Ambulatory Visit: Payer: Self-pay | Admitting: Family Medicine

## 2023-12-29 VITALS — BP 124/82 | HR 57 | Wt 208.0 lb

## 2023-12-29 DIAGNOSIS — Z7984 Long term (current) use of oral hypoglycemic drugs: Secondary | ICD-10-CM | POA: Diagnosis not present

## 2023-12-29 DIAGNOSIS — I10 Essential (primary) hypertension: Secondary | ICD-10-CM | POA: Diagnosis not present

## 2023-12-29 DIAGNOSIS — E119 Type 2 diabetes mellitus without complications: Secondary | ICD-10-CM

## 2023-12-29 DIAGNOSIS — Z0000 Encounter for general adult medical examination without abnormal findings: Secondary | ICD-10-CM

## 2023-12-29 DIAGNOSIS — E118 Type 2 diabetes mellitus with unspecified complications: Secondary | ICD-10-CM | POA: Diagnosis not present

## 2023-12-29 DIAGNOSIS — Z23 Encounter for immunization: Secondary | ICD-10-CM

## 2023-12-29 LAB — POCT GLYCOSYLATED HEMOGLOBIN (HGB A1C): Hemoglobin A1C: 6.5 % — AB (ref 4.0–5.6)

## 2023-12-29 MED ORDER — ATORVASTATIN CALCIUM 40 MG PO TABS
40.0000 mg | ORAL_TABLET | Freq: Every day | ORAL | 1 refills | Status: AC
  Filled 2023-12-29 – 2024-01-28 (×2): qty 90, 90d supply, fill #0

## 2023-12-29 MED ORDER — LISINOPRIL 10 MG PO TABS
10.0000 mg | ORAL_TABLET | Freq: Every day | ORAL | 1 refills | Status: AC
Start: 2023-12-29 — End: ?
  Filled 2023-12-29 – 2024-01-28 (×2): qty 90, 90d supply, fill #0

## 2023-12-29 MED ORDER — METFORMIN HCL ER 500 MG PO TB24
500.0000 mg | ORAL_TABLET | Freq: Every day | ORAL | 0 refills | Status: AC
  Filled 2023-12-29: qty 90, 90d supply, fill #0

## 2023-12-29 NOTE — Progress Notes (Signed)
=    Name: Randall Calderon   Date of Visit: 12/29/23   Date of last visit with me: 12/22/2023   CHIEF COMPLAINT:  Chief Complaint  Patient presents with   Medical Management of Chronic Issues    Med check. No concerns, would like 2nd shingrix  . Requested eye exam from [facility]       HPI:  Discussed the use of AI scribe software for clinical note transcription with the patient, who gave verbal consent to proceed.  History of Present Illness Randall Calderon is a 58 year old male who presents for follow-up of prediabetes.  His last hemoglobin A1c was 6.5%, and his previous A1c was 6.4%.  He has gained approximately two pounds per month since his last visit in August, totaling about six pounds over the past three months. 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 Will attempt to work on weight reduction through caloric deficit.      OBJECTIVE:       09/26/2023   10:09 AM  Depression screen PHQ 2/9  Decreased Interest 0  Down, Depressed, Hopeless 0  PHQ - 2 Score 0     BP Readings from Last 3 Encounters:  12/29/23 124/82  09/26/23 128/70  12/05/22 126/86    BP 124/82   Pulse (!) 57   Wt 208 lb (94.3 kg)   SpO2 99%   BMI 30.72 kg/m    Physical Exam    Physical Exam Constitutional:      Appearance: Normal appearance.  Neurological:     General: No focal deficit present.     Mental Status: He is alert and oriented to person, place, and time. Mental status is at baseline.     ASSESSMENT/PLAN:   Assessment & Plan Need for shingles vaccine  Diabetes mellitus treated with oral medication (HCC)  Essential hypertension  Annual physical exam  Controlled type 2 diabetes mellitus with complication, without long-term current use of insulin  (HCC)    Assessment and Plan Assessment & Plan Type 2 diabetes mellitus Hemoglobin A1c is 6.5%, indicating diabetes. Previous A1c was 6.3%, prediabetic. Emphasized  weight loss to potentially reverse diabetes. Discussed genetic role in diabetes progression. - Continue current management plan. - Repeat hemoglobin A1c in 3 to 6 months, per his preference. - Lisinopril , lipitor and metformin  refilled.   Overweight Weight gain of 2 pounds per month over three months. Emphasized weight loss for diabetes management and overall health. - Encouraged weight loss efforts.     Randall Calderon A. Vita MD [facility]

## 2024-01-28 ENCOUNTER — Other Ambulatory Visit (HOSPITAL_COMMUNITY): Payer: Self-pay

## 2024-01-28 ENCOUNTER — Other Ambulatory Visit: Payer: Self-pay

## 2024-06-28 ENCOUNTER — Ambulatory Visit: Admitting: Family Medicine

## 2024-10-01 ENCOUNTER — Encounter: Payer: Self-pay | Admitting: Family Medicine
